# Patient Record
Sex: Female | Born: 2000 | Race: White | Hispanic: No | Smoking: Former smoker
Health system: Southern US, Community
[De-identification: ages and names within clinical notes are randomized; demographics above are authoritative.]

## PROBLEM LIST (undated history)

## (undated) DIAGNOSIS — F329 Major depressive disorder, single episode, unspecified: Secondary | ICD-10-CM

## (undated) DIAGNOSIS — N189 Chronic kidney disease, unspecified: Secondary | ICD-10-CM

## (undated) DIAGNOSIS — N920 Excessive and frequent menstruation with regular cycle: Secondary | ICD-10-CM

## (undated) DIAGNOSIS — M419 Scoliosis, unspecified: Secondary | ICD-10-CM

## (undated) DIAGNOSIS — F32A Depression, unspecified: Secondary | ICD-10-CM

## (undated) DIAGNOSIS — F419 Anxiety disorder, unspecified: Secondary | ICD-10-CM

## (undated) DIAGNOSIS — G43909 Migraine, unspecified, not intractable, without status migrainosus: Secondary | ICD-10-CM

## (undated) DIAGNOSIS — N390 Urinary tract infection, site not specified: Secondary | ICD-10-CM

## (undated) HISTORY — DX: Major depressive disorder, single episode, unspecified: F32.9

## (undated) HISTORY — DX: Urinary tract infection, site not specified: N39.0

## (undated) HISTORY — DX: Excessive and frequent menstruation with regular cycle: N92.0

## (undated) HISTORY — DX: Anxiety disorder, unspecified: F41.9

## (undated) HISTORY — PX: NO PAST SURGERIES: SHX2092

## (undated) HISTORY — DX: Migraine, unspecified, not intractable, without status migrainosus: G43.909

## (undated) HISTORY — PX: OTHER SURGICAL HISTORY: SHX169

## (undated) HISTORY — DX: Scoliosis, unspecified: M41.9

---
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
## 2020-01-07 DIAGNOSIS — J029 Acute pharyngitis, unspecified: Secondary | ICD-10-CM | POA: Diagnosis not present

## 2020-01-07 DIAGNOSIS — J039 Acute tonsillitis, unspecified: Secondary | ICD-10-CM | POA: Diagnosis not present

## 2020-02-13 ENCOUNTER — Ambulatory Visit: Payer: Medicaid Other | Admitting: Nurse Practitioner

## 2020-02-15 ENCOUNTER — Encounter: Payer: Self-pay | Admitting: Physician Assistant

## 2020-03-30 DIAGNOSIS — R002 Palpitations: Secondary | ICD-10-CM | POA: Diagnosis not present

## 2020-03-30 DIAGNOSIS — R079 Chest pain, unspecified: Secondary | ICD-10-CM | POA: Diagnosis not present

## 2020-03-31 DIAGNOSIS — R000 Tachycardia, unspecified: Secondary | ICD-10-CM | POA: Diagnosis not present

## 2020-03-31 DIAGNOSIS — I517 Cardiomegaly: Secondary | ICD-10-CM | POA: Diagnosis not present

## 2020-04-22 ENCOUNTER — Ambulatory Visit: Payer: Medicaid Other | Admitting: Nurse Practitioner

## 2020-04-22 ENCOUNTER — Encounter: Payer: Self-pay | Admitting: General Practice

## 2020-05-17 DIAGNOSIS — R5383 Other fatigue: Secondary | ICD-10-CM | POA: Diagnosis not present

## 2020-05-17 DIAGNOSIS — R0602 Shortness of breath: Secondary | ICD-10-CM | POA: Diagnosis not present

## 2020-05-17 DIAGNOSIS — R1084 Generalized abdominal pain: Secondary | ICD-10-CM | POA: Diagnosis not present

## 2020-05-17 DIAGNOSIS — R110 Nausea: Secondary | ICD-10-CM | POA: Diagnosis not present

## 2020-05-17 DIAGNOSIS — L679 Hair color and hair shaft abnormality, unspecified: Secondary | ICD-10-CM | POA: Diagnosis not present

## 2020-05-17 DIAGNOSIS — R630 Anorexia: Secondary | ICD-10-CM | POA: Diagnosis not present

## 2020-05-21 DIAGNOSIS — F411 Generalized anxiety disorder: Secondary | ICD-10-CM | POA: Diagnosis not present

## 2020-05-21 DIAGNOSIS — F32A Depression, unspecified: Secondary | ICD-10-CM | POA: Diagnosis not present

## 2020-05-28 ENCOUNTER — Ambulatory Visit: Payer: Self-pay | Admitting: Obstetrics and Gynecology

## 2020-05-28 DIAGNOSIS — L049 Acute lymphadenitis, unspecified: Secondary | ICD-10-CM | POA: Diagnosis not present

## 2020-05-28 DIAGNOSIS — Z91040 Latex allergy status: Secondary | ICD-10-CM | POA: Diagnosis not present

## 2020-05-28 DIAGNOSIS — R1909 Other intra-abdominal and pelvic swelling, mass and lump: Secondary | ICD-10-CM | POA: Diagnosis not present

## 2020-05-28 DIAGNOSIS — M7989 Other specified soft tissue disorders: Secondary | ICD-10-CM | POA: Diagnosis not present

## 2020-05-28 DIAGNOSIS — Z91041 Radiographic dye allergy status: Secondary | ICD-10-CM | POA: Diagnosis not present

## 2020-05-28 DIAGNOSIS — Z882 Allergy status to sulfonamides status: Secondary | ICD-10-CM | POA: Diagnosis not present

## 2020-05-28 DIAGNOSIS — M79605 Pain in left leg: Secondary | ICD-10-CM | POA: Diagnosis not present

## 2020-05-29 DIAGNOSIS — R0789 Other chest pain: Secondary | ICD-10-CM | POA: Diagnosis not present

## 2020-05-29 DIAGNOSIS — R590 Localized enlarged lymph nodes: Secondary | ICD-10-CM | POA: Diagnosis not present

## 2020-05-29 DIAGNOSIS — F419 Anxiety disorder, unspecified: Secondary | ICD-10-CM | POA: Diagnosis not present

## 2020-05-29 DIAGNOSIS — R0602 Shortness of breath: Secondary | ICD-10-CM | POA: Diagnosis not present

## 2020-05-29 DIAGNOSIS — R457 State of emotional shock and stress, unspecified: Secondary | ICD-10-CM | POA: Diagnosis not present

## 2020-05-29 DIAGNOSIS — R079 Chest pain, unspecified: Secondary | ICD-10-CM | POA: Diagnosis not present

## 2020-05-30 ENCOUNTER — Ambulatory Visit: Payer: Self-pay | Admitting: Advanced Practice Midwife

## 2020-05-30 DIAGNOSIS — F32A Depression, unspecified: Secondary | ICD-10-CM | POA: Diagnosis not present

## 2020-05-30 DIAGNOSIS — F4521 Hypochondriasis: Secondary | ICD-10-CM | POA: Diagnosis not present

## 2020-05-30 DIAGNOSIS — F411 Generalized anxiety disorder: Secondary | ICD-10-CM | POA: Diagnosis not present

## 2020-05-31 DIAGNOSIS — R002 Palpitations: Secondary | ICD-10-CM | POA: Diagnosis not present

## 2020-05-31 DIAGNOSIS — R7989 Other specified abnormal findings of blood chemistry: Secondary | ICD-10-CM | POA: Diagnosis not present

## 2020-05-31 DIAGNOSIS — G4700 Insomnia, unspecified: Secondary | ICD-10-CM | POA: Diagnosis not present

## 2020-05-31 DIAGNOSIS — R634 Abnormal weight loss: Secondary | ICD-10-CM | POA: Diagnosis not present

## 2020-05-31 DIAGNOSIS — F419 Anxiety disorder, unspecified: Secondary | ICD-10-CM | POA: Diagnosis not present

## 2020-05-31 DIAGNOSIS — R200 Anesthesia of skin: Secondary | ICD-10-CM | POA: Diagnosis not present

## 2020-06-04 ENCOUNTER — Ambulatory Visit: Payer: Self-pay | Admitting: Adult Health

## 2020-06-04 DIAGNOSIS — F411 Generalized anxiety disorder: Secondary | ICD-10-CM | POA: Diagnosis not present

## 2020-06-04 DIAGNOSIS — F4521 Hypochondriasis: Secondary | ICD-10-CM | POA: Diagnosis not present

## 2020-06-04 DIAGNOSIS — F32A Depression, unspecified: Secondary | ICD-10-CM | POA: Diagnosis not present

## 2020-06-05 ENCOUNTER — Ambulatory Visit (INDEPENDENT_AMBULATORY_CARE_PROVIDER_SITE_OTHER): Payer: Medicaid Other | Admitting: Adult Health

## 2020-06-05 ENCOUNTER — Encounter: Payer: Self-pay | Admitting: Adult Health

## 2020-06-05 ENCOUNTER — Other Ambulatory Visit: Payer: Self-pay

## 2020-06-05 VITALS — BP 120/80 | HR 55 | Ht 66.0 in | Wt 108.5 lb

## 2020-06-05 DIAGNOSIS — R599 Enlarged lymph nodes, unspecified: Secondary | ICD-10-CM | POA: Diagnosis not present

## 2020-06-05 NOTE — Progress Notes (Addendum)
  Subjective:     Patient ID: Yolanda Myers, female   DOB: 2001-04-10, 19 y.o.   MRN: [id number]  HPI Yolanda Myers is a 19 year old white female,single G0P0, in complaining of lump in left groin area, noticed 05/27/20 and it is tender and seems to have moved. First noticed when cat stepped on it.   Review of Systems Lump in left groin area noted 10.18/21 it is tender +hair loss,she had TSH 1.130 and Free T4 1.52 at [facility] 05/29/20. Reviewed past medical,surgical, social and family history. Reviewed medications and allergies.     Objective:   Physical Exam BP 120/80 (BP Location: Left Arm, Patient Position: Sitting, Cuff Size: Normal)   Pulse (!) 55   Ht 5\' 6"  (1.676 m)   Wt 108 lb 8 oz (49.2 kg)   LMP 05/29/2020 (Approximate)   BMI 17.51 kg/m   Skin warm and dry. Neck: mid line trachea, normal thyroid, good ROM, no lymphadenopathy noted. Hair is dyed blue on ends Has firm tender 1 cm lymph node in left groin, none on the right Examination chaperoned by 05/31/2020 LPN   Fall risk is low.   Upstream - 06/05/20 1023      Pregnancy Intention Screening   Does the patient want to become pregnant in the next year? No    Does the patient's partner want to become pregnant in the next year? No    Would the patient like to discuss contraceptive options today? No      Contraception Wrap Up   Current Method Abstinence    End Method Abstinence    Contraception Counseling Provided No          Assessment:     Lymph node enlargement - Plan: CBC w/Diff      Plan:     Return in 4 weeks for recheck, if has not resolved, will get 06/07/20  Get PCP

## 2020-06-06 LAB — CBC WITH DIFFERENTIAL/PLATELET
Basophils Absolute: 0.1 10*3/uL (ref 0.0–0.2)
Basos: 1 %
EOS (ABSOLUTE): 0.2 10*3/uL (ref 0.0–0.4)
Eos: 3 %
Hematocrit: 40.8 % (ref 34.0–46.6)
Hemoglobin: 14 g/dL (ref 11.1–15.9)
Immature Grans (Abs): 0 10*3/uL (ref 0.0–0.1)
Immature Granulocytes: 0 %
Lymphocytes Absolute: 2.9 10*3/uL (ref 0.7–3.1)
Lymphs: 45 %
MCH: 30.9 pg (ref 26.6–33.0)
MCHC: 34.3 g/dL (ref 31.5–35.7)
MCV: 90 fL (ref 79–97)
Monocytes Absolute: 0.7 10*3/uL (ref 0.1–0.9)
Monocytes: 10 %
Neutrophils Absolute: 2.6 10*3/uL (ref 1.4–7.0)
Neutrophils: 41 %
Platelets: 283 10*3/uL (ref 150–450)
RBC: 4.53 x10E6/uL (ref 3.77–5.28)
RDW: 12.7 % (ref 11.7–15.4)
WBC: 6.3 10*3/uL (ref 3.4–10.8)

## 2020-06-10 ENCOUNTER — Telehealth: Payer: Self-pay | Admitting: *Deleted

## 2020-06-10 NOTE — Telephone Encounter (Signed)
Pt aware CBC was normal. Pt voiced understanding. JSY

## 2020-06-10 NOTE — Telephone Encounter (Signed)
-----   Message from Adline Potter, NP sent at 06/07/2020  4:34 PM EDT ----- Let pt know CBC was normal

## 2020-06-18 DIAGNOSIS — E0590 Thyrotoxicosis, unspecified without thyrotoxic crisis or storm: Secondary | ICD-10-CM | POA: Diagnosis not present

## 2020-07-03 ENCOUNTER — Ambulatory Visit: Payer: Medicaid Other | Admitting: Adult Health

## 2020-07-08 DIAGNOSIS — R0789 Other chest pain: Secondary | ICD-10-CM | POA: Diagnosis not present

## 2020-07-08 DIAGNOSIS — R079 Chest pain, unspecified: Secondary | ICD-10-CM | POA: Diagnosis not present

## 2020-07-09 ENCOUNTER — Telehealth: Payer: Self-pay

## 2020-07-09 NOTE — Telephone Encounter (Signed)
Transition Care Management Unsuccessful Follow-up Telephone Call  Date of discharge and from where: 07/08/2020 [facility] ED  Attempts:  1st Attempt  Reason for unsuccessful TCM follow-up call:  Left voice message

## 2020-07-10 NOTE — Telephone Encounter (Signed)
Transition Care Management Unsuccessful Follow-up Telephone Call  Date of discharge and from where:  07/08/2020 [facility] ED  Attempts:  2nd Attempt  Reason for unsuccessful TCM follow-up call:  Left voice message

## 2020-07-11 NOTE — Telephone Encounter (Signed)
Transition Care Management Unsuccessful Follow-up Telephone Call  Date of discharge and from where:   07/08/2020 [facility] ED  Attempts:  3rd Attempt  Reason for unsuccessful TCM follow-up call:  Left voice message

## 2020-07-16 ENCOUNTER — Other Ambulatory Visit: Payer: Self-pay

## 2020-07-16 ENCOUNTER — Other Ambulatory Visit (HOSPITAL_COMMUNITY)
Admission: RE | Admit: 2020-07-16 | Discharge: 2020-07-16 | Disposition: A | Discharge status: Home / Self Care | Payer: Medicaid Other | Source: Ambulatory Visit | Attending: Adult Health | Admitting: Adult Health

## 2020-07-16 ENCOUNTER — Ambulatory Visit (INDEPENDENT_AMBULATORY_CARE_PROVIDER_SITE_OTHER): Payer: Medicaid Other | Admitting: Adult Health

## 2020-07-16 ENCOUNTER — Encounter: Payer: Self-pay | Admitting: Adult Health

## 2020-07-16 VITALS — BP 86/54 | HR 86 | Ht 65.0 in | Wt 112.0 lb

## 2020-07-16 DIAGNOSIS — R599 Enlarged lymph nodes, unspecified: Secondary | ICD-10-CM

## 2020-07-16 DIAGNOSIS — N898 Other specified noninflammatory disorders of vagina: Secondary | ICD-10-CM | POA: Diagnosis not present

## 2020-07-16 NOTE — Progress Notes (Signed)
  Subjective:     Patient ID: Yolanda Myers, female   DOB: [birth year], 19 y.o.   MRN: [id number]  HPI Yolanda Myers is a 19 year old white female,single, G0P0 back in follow up on swollen lymph node in groin, it has resolved, had discharge about 2 weeks, wondered if BV. WBC was 6.3 on 06/05/20.    Review of Systems Had vaginal discharge 2 weeks ago No lymph node now  Reviewed past medical,surgical, social and family history. Reviewed medications and allergies.     Objective:   Physical Exam BP (!) 86/54 (BP Location: Left Arm, Patient Position: Sitting, Cuff Size: Normal)   Pulse 86   Ht 5\' 5"  (1.651 m)   Wt 112 lb (50.8 kg)   LMP 06/24/2020 (Approximate)   BMI 18.64 kg/m   Skin warm and dry and lymph node in left groin has resolved. She did self vaginal swab.   Upstream - 07/16/20 1359      Pregnancy Intention Screening   Does the patient want to become pregnant in the next year? No    Does the patient's partner want to become pregnant in the next year? No    Would the patient like to discuss contraceptive options today? No      Contraception Wrap Up   Current Method No Contraceptive Precautions    End Method No Contraception Precautions    Contraception Counseling Provided No             Assessment:     1. Lymph node enlargement,resolved   2. Vaginal discharge CV swab sent for GC/CHL,trich and BV,yeast     Plan:     Follow up prn

## 2020-07-18 LAB — CERVICOVAGINAL ANCILLARY ONLY
Bacterial Vaginitis (gardnerella): NEGATIVE
Candida Glabrata: NEGATIVE
Candida Vaginitis: NEGATIVE
Chlamydia: NEGATIVE
Comment: NEGATIVE
Comment: NEGATIVE
Comment: NEGATIVE
Comment: NEGATIVE
Comment: NEGATIVE
Comment: NORMAL
Neisseria Gonorrhea: NEGATIVE
Trichomonas: NEGATIVE

## 2020-09-04 DIAGNOSIS — Z1159 Encounter for screening for other viral diseases: Secondary | ICD-10-CM | POA: Diagnosis not present

## 2020-11-13 ENCOUNTER — Encounter: Payer: Self-pay | Admitting: *Deleted

## 2020-11-14 ENCOUNTER — Ambulatory Visit: Payer: Medicaid Other | Admitting: Cardiology

## 2020-11-14 ENCOUNTER — Encounter: Payer: Self-pay | Admitting: Cardiology

## 2020-11-14 NOTE — Progress Notes (Deleted)
Cardiology Office Note  Date: 11/14/2020   ID: Nehal, Witting 04-06-2001, MRN [id number]  PCP:  Jason Coop, FNP  Cardiologist:  Nona Dell, MD Electrophysiologist:  None   No chief complaint on file.   History of Present Illness: Yolanda Myers is a 20 y.o. female referred for cardiology consultation by Mr. Harle Stanford NP for the evaluation of chest pain.  Past Medical History:  Diagnosis Date  . Anxiety   . Frequent UTI   . Heavy menses   . Migraine headache     Past Surgical History:  Procedure Laterality Date  . No prior surgeries      Current Outpatient Medications  Medication Sig Dispense Refill  . omeprazole (PRILOSEC) 20 MG capsule Take 1 capsule (20 mg total) by mouth daily. 30 capsule 11   No current facility-administered medications for this visit.   Allergies:  Imitrex [sumatriptan] and Sulfa antibiotics   Social History: The patient  reports that she has quit smoking. Her smoking use included cigarettes and e-cigarettes. She has never used smokeless tobacco. She reports current alcohol use. She reports that she does not use drugs.   Family History: The patient's family history includes Asthma in her maternal grandfather, maternal grandmother, and mother; COPD in her maternal grandmother; Depression in her maternal grandmother; Hearing loss in her maternal grandmother; Heart disease in her maternal grandmother; Hypertension in her maternal grandfather and maternal grandmother; Multiple sclerosis in her maternal grandmother; Seizures in her maternal grandmother.   ROS:  Please see the history of present illness. Otherwise, complete review of systems is positive for {NONE DEFAULTED:18576::"none"}.  All other systems are reviewed and negative.   Physical Exam: VS:  There were no vitals taken for this visit., BMI There is no height or weight on file to calculate BMI.  Wt Readings from Last 3 Encounters:  07/16/20 112 lb (50.8 kg) (19  %, Z= -0.88)*  06/05/20 108 lb 8 oz (49.2 kg) (13 %, Z= -1.11)*  08/15/19 107 lb 6.4 oz (48.7 kg) (14 %, Z= -1.10)*   * Growth percentiles are based on CDC (Girls, 2-20 Years) data.    General: Patient appears comfortable at rest. HEENT: Conjunctiva and lids normal, oropharynx clear with moist mucosa. Neck: Supple, no elevated JVP or carotid bruits, no thyromegaly. Lungs: Clear to auscultation, nonlabored breathing at rest. Cardiac: Regular rate and rhythm, no S3 or significant systolic murmur, no pericardial rub. Abdomen: Soft, nontender, no hepatomegaly, bowel sounds present, no guarding or rebound. Extremities: No pitting edema, distal pulses 2+. Skin: Warm and dry. Musculoskeletal: No kyphosis. Neuropsychiatric: Alert and oriented x3, affect grossly appropriate.  ECG:  An ECG dated 02/09/2017 was personally reviewed today and demonstrated:  Sinus rhythm, possible left atrial enlargement  Recent Labwork: 06/05/2020: Hemoglobin 14.0; Platelets 283  February 2022: Hemoglobin 11.5, potassium 3.9, BUN 5, creatinine 0.57, troponin T 106 and 109  Other Studies Reviewed Today:  Chest CTA 10/05/2020 ([facility]): PULMONARY ARTERIES:  No pulmonary emboli are identified.  RV/LV ratio: <0.9  Interventricular septum: No bowing  IVC/hepatic vein reflux: None   LUNGS/PLEURA:  No focal airspace opacities/consolidations.  No pneumothorax.  There is a small left upper lobe pulmonary nodule image 32. There are 3 small pulmonary nodules in the right middle lobe and right lower lobe on images 63, 64 and 69. The nodules are in the 3 to 4 mm range.  No pleural effusions.   HEART/MEDIASTINUM:  Cardiac size is normal.  No acute thoracic  aortic abnormalities.  No hilar, mediastinal, or axillary lymphadenopathy.   MUSCULOSKELETAL:  No acute or destructive osseous processes.   MISC:  N.A.   IMPRESSION:  1.No pulmonary embolism.  2. No acute infiltrate.  3. Small bilateral pulmonary nodules.  See below   Excerpt from "Guidelines for Management of Small Pulmonary Nodules Detected on CT Scans: A Statement from the Fleischner Society" (Radiology 2005;237:395-400).   1. Nodules less than or equal to 75mm: No follow up needed if the patient is at low risk for malignancy (risk of malignancy in this category is less than 1%). If patient is at increased risk, 12 month follow up is recommended. If unchanged after 12 months, no further work-up is needed.   2. Nodules greater than 4 and up to 76mm: If low risk, initial follow up at 12 months. If unchanged after 12 months, no further follow-up. For patients at increased risk of malignancy, initial follow up CT at 6-12 months, then at 18-24 months if unchanged.   3. Nodules greater than 6 and up to 39mm: If low risk, initial follow up CT at 6-12 months, then at 18-24 months if unchanged. For patients at increased risk of malignancy, initial follow up CT at 3-6 months, then 9-12, and at 24 months if no change.   4. Nodules greater than 31mm: For either low or high risk patients, initial follow up at 3 months. Continued follow up at 9 and 24 months (from baseline) if stable at each interval. Alternatively consider dynamic contrast enhanced CT, PET, or biopsy.   (Note: Low risk is defined as minimal or no history of smoking and of other known risk factors.)    Assessment and Plan:    Medication Adjustments/Labs and Tests Ordered: Current medicines are reviewed at length with the patient today.  Concerns regarding medicines are outlined above.   Tests Ordered: No orders of the defined types were placed in this encounter.   Medication Changes: No orders of the defined types were placed in this encounter.   Disposition:  Follow up {follow up:15908}  Signed, Jonelle Sidle, MD, [facility] 11/14/2020 2:17 PM    [facility] at [facility] [address] [place], [place], [place] [address] Phone: [phone]; Fax: [phone]

## 2020-11-22 ENCOUNTER — Other Ambulatory Visit: Payer: Self-pay

## 2020-11-22 ENCOUNTER — Ambulatory Visit (INDEPENDENT_AMBULATORY_CARE_PROVIDER_SITE_OTHER): Payer: Medicaid Other | Admitting: Cardiology

## 2020-11-22 ENCOUNTER — Other Ambulatory Visit
Admission: RE | Admit: 2020-11-22 | Discharge: 2020-11-22 | Disposition: A | Discharge status: Home / Self Care | Payer: Medicaid Other | Source: Ambulatory Visit | Attending: Cardiology | Admitting: Cardiology

## 2020-11-22 ENCOUNTER — Encounter: Payer: Self-pay | Admitting: Cardiology

## 2020-11-22 VITALS — BP 134/70 | HR 114 | Ht 65.0 in | Wt 111.0 lb

## 2020-11-22 DIAGNOSIS — R079 Chest pain, unspecified: Secondary | ICD-10-CM | POA: Insufficient documentation

## 2020-11-22 LAB — PREGNANCY, URINE: Preg Test, Ur: NEGATIVE

## 2020-11-22 LAB — HCG, QUANTITATIVE, PREGNANCY: hCG, Beta Chain, Quant, S: <1 m[IU]/mL (ref <5)

## 2020-11-22 NOTE — Progress Notes (Signed)
Cardiology Office Note:    Date:  11/22/2020   ID:  Yolanda, Myers 13-Apr-2001, MRN [id number]  PCP:  Jason Coop, FNP   [facility]  Cardiologist:  No primary care provider on file.  Advanced Practice Provider:  No care team member to display Electrophysiologist:  None       Referring MD: Bennie Hind I*   Chief Complaint  Patient presents with  . New Patient (Initial Visit)    Referred by PCP for chest pain. Meds reviewed verbally with patient.     History of Present Illness:    Yolanda Myers is a 20 y.o. female with a hx of anxiety, smoking who presents due to chest pain.  Patient states having symptoms of chest pain beginning September 2021 after presenting to the hospital to obtain an ultrasound.  Describes symptoms as pressure, occasionally sharp.  Symptoms are usually on the left side, sometimes central.  She has presented to the emergency room/urgent care multiple times, symptoms have been related to anxiety.  Currently seen in [place], no appointment was available at the [facility] office.  States having abnormal troponin levels.  Past Medical History:  Diagnosis Date  . Anxiety   . Frequent UTI   . Heavy menses   . Migraine headache     Past Surgical History:  Procedure Laterality Date  . No prior surgeries      Current Medications: Current Meds  Medication Sig  . nitrofurantoin, macrocrystal-monohydrate, (MACROBID) 100 MG capsule Take 100 mg by mouth 2 (two) times daily.  Marland Kitchen omeprazole (PRILOSEC) 20 MG capsule Take 1 capsule (20 mg total) by mouth daily.     Allergies:   Imitrex [sumatriptan] and Sulfa antibiotics   Social History   Socioeconomic History  . Marital status: Single    Spouse name: Not on file  . Number of children: Not on file  . Years of education: Not on file  . Highest education level: Not on file  Occupational History  . Not on file  Tobacco Use  . Smoking status: Former Smoker     Types: Cigarettes, E-cigarettes  . Smokeless tobacco: Never Used  Vaping Use  . Vaping Use: Some days  Substance and Sexual Activity  . Alcohol use: Yes    Comment: Occasional  . Drug use: No  . Sexual activity: Not on file  Other Topics Concern  . Not on file  Social History Narrative   Yolanda Myers is a 20 yo girl.   She is in the process of getting her GED.   She lives with her mom only.   She has no siblings.   She enjoys painting, drawing, and being outside.   Social Determinants of Corporate investment banker Strain: Not on file  Food Insecurity: Not on file  Transportation Needs: Not on file  Physical Activity: Not on file  Stress: Not on file  Social Connections: Not on file     Family History: The patient's family history includes Asthma in her maternal grandfather, maternal grandmother, and mother; COPD in her maternal grandmother; Depression in her maternal grandmother; Hearing loss in her maternal grandmother; Heart disease in her maternal grandmother; Hypertension in her maternal grandfather and maternal grandmother; Multiple sclerosis in her maternal grandmother; Seizures in her maternal grandmother.  ROS:   Please see the history of present illness.     All other systems reviewed and are negative.  EKGs/Labs/Other Studies Reviewed:    The  following studies were reviewed today:   EKG:  EKG is  ordered today.  The ekg ordered today demonstrates sinus tachycardia  Recent Labs: 06/05/2020: Hemoglobin 14.0; Platelets 283  Recent Lipid Panel No results found for: CHOL, TRIG, HDL, CHOLHDL, VLDL, LDLCALC, LDLDIRECT   Risk Assessment/Calculations:      Physical Exam:    VS:  BP 134/70 (BP Location: Left Arm, Patient Position: Sitting, Cuff Size: Normal)   Pulse (!) 114   Ht 5\' 5"  (1.651 m)   Wt 111 lb (50.3 kg)   SpO2 99%   BMI 18.47 kg/m     Wt Readings from Last 3 Encounters:  11/22/20 111 lb (50.3 kg) (17 %, Z= -0.97)*  07/16/20 112 lb (50.8 kg)  (19 %, Z= -0.88)*  06/05/20 108 lb 8 oz (49.2 kg) (13 %, Z= -1.11)*   * Growth percentiles are based on CDC (Girls, 2-20 Years) data.     GEN:  Well nourished, well developed in no acute distress HEENT: Normal NECK: No JVD; No carotid bruits LYMPHATICS: No lymphadenopathy CARDIAC: RRR, no murmurs, rubs, gallops RESPIRATORY:  Clear to auscultation without rales, wheezing or rhonchi  ABDOMEN: Soft, non-tender, non-distended MUSCULOSKELETAL:  No edema; No deformity  SKIN: Warm and dry NEUROLOGIC:  Alert and oriented x 3 PSYCHIATRIC:  Normal affect   ASSESSMENT:    1. Chest pain, unspecified type    PLAN:    In order of problems listed above:  1. Patient with chest pain, history of smoking.  Multiple visits to urgent care due to chest pain.  Symptoms could be attributed to anxiety due to low cardiac risk, but cardiac evaluation will go along with to reassure patient if normal.  Obtain echocardiogram, Lexiscan Myoview to evaluate presence of ischemia and heart function.  We will call patient on test results.  Plan follow-up if abnormal findings are noted.  She lives in the [place], plans to follow-up with our clinic there for cardiac concerns due to easier commute.   Shared Decision Making/Informed Consent The risks [chest pain, shortness of breath, cardiac arrhythmias, dizziness, blood pressure fluctuations, myocardial infarction, stroke/transient ischemic attack, nausea, vomiting, allergic reaction, radiation exposure, metallic taste sensation and life-threatening complications (estimated to be 1 in 10,000)], benefits (risk stratification, diagnosing coronary artery disease, treatment guidance) and alternatives of a nuclear stress test were discussed in detail with Yolanda Myers and she agrees to proceed.       Medication Adjustments/Labs and Tests Ordered: Current medicines are reviewed at length with the patient today.  Concerns regarding medicines are outlined above.  Orders Placed  This Encounter  Procedures  . NM Myocar Multi W/Spect W/Wall Motion / EF  . Pregnancy, urine  . EKG 12-Lead  . ECHOCARDIOGRAM COMPLETE   No orders of the defined types were placed in this encounter.   Patient Instructions  Medication Instructions:   *If you need a refill on your cardiac medications before your next appointment, please call your pharmacy*   Lab Work: None ordered If you have labs (blood work) drawn today and your tests are completely normal, you will receive your results only by: Gwenevere Abbot MyChart Message (if you have MyChart) OR . A paper copy in the mail If you have any lab test that is abnormal or we need to change your treatment, we will call you to review the results.   Testing/Procedures:  1.  Your physician has requested that you have an echocardiogram. Echocardiography is a painless test that uses sound waves to create  images of your heart. It provides your doctor with information about the size and shape of your heart and how well your heart's chambers and valves are working. This procedure takes approximately one hour. There are no restrictions for this procedure.   2.  [facility] MYOVIEW       Your caregiver has ordered a Stress Test with nuclear imaging. The purpose of this test is to evaluate the blood supply to your heart muscle. This procedure is referred to as a "Non-Invasive Stress Test." This is because other than having an IV started in your vein, nothing is inserted or "invades" your body. Cardiac stress tests are done to find areas of poor blood flow to the heart by determining the extent of coronary artery disease (CAD). Some patients exercise on a treadmill, which naturally increases the blood flow to your heart, while others who are  unable to walk on a treadmill due to physical limitations have a pharmacologic/chemical stress agent called Lexiscan . This medicine will mimic walking on a treadmill by temporarily increasing your coronary blood flow.       PLEASE REPORT TO [facility] MEDICAL MALL ENTRANCE   THE VOLUNTEERS AT THE FIRST DESK WILL DIRECT YOU WHERE TO GO     *Please note: these test may take anywhere between 2-4 hours to complete       Date of Procedure:_____________________________________   Arrival Time for Procedure:______________________________    PLEASE NOTIFY THE OFFICE AT LEAST 24 HOURS IN ADVANCE IF YOU ARE UNABLE TO KEEP YOUR APPOINTMENT.  [phone]  PLEASE NOTIFY NUCLEAR MEDICINE AT [facility] AT LEAST 24 HOURS IN ADVANCE IF YOU ARE UNABLE TO KEEP YOUR APPOINTMENT. [phone]      How to prepare for your Myoview test:      1. Do not eat or drink after midnight  2. No caffeine for 24 hours prior to test  3. No smoking 24 hours prior to test.  4. Unless instructed otherwise, Take your medication with a small sips of water.    5.         Ladies, please do not wear dresses. Skirts or pants are appropriate. Please wear a short sleeve shirt.  6. No perfume, cologne or lotion.  7. Wear comfortable walking shoes. No heels!      Follow-Up: At [facility], you and your health needs are our priority.  As part of our continuing mission to provide you with exceptional heart care, we have created designated Provider Care Teams.  These Care Teams include your primary Cardiologist (physician) and Advanced Practice Providers (APPs -  Physician Assistants and Nurse Practitioners) who all work together to provide you with the care you need, when you need it.  We recommend signing up for the patient portal called "MyChart".  Sign up information is provided on this After Visit Summary.  MyChart is used to connect with patients for Virtual Visits (Telemedicine).  Patients are able to view lab/test results, encounter notes, upcoming appointments, etc.  Non-urgent messages can be sent to your provider as well.   To learn more about what you can do with MyChart, go to [email or web address].    Your next appointment:    Patient to  follow up at [facility] office [phone]  The format for your next appointment:   In Person  Provider:   Debbe Odea, MD   Other Instructions      Signed, Debbe Odea, MD  11/22/2020 4:57 PM    [facility]

## 2020-11-22 NOTE — Patient Instructions (Signed)
Medication Instructions:   *If you need a refill on your cardiac medications before your next appointment, please call your pharmacy*   Lab Work: None ordered If you have labs (blood work) drawn today and your tests are completely normal, you will receive your results only by: Marland Kitchen MyChart Message (if you have MyChart) OR . A paper copy in the mail If you have any lab test that is abnormal or we need to change your treatment, we will call you to review the results.   Testing/Procedures:  1.  Your physician has requested that you have an echocardiogram. Echocardiography is a painless test that uses sound waves to create images of your heart. It provides your doctor with information about the size and shape of your heart and how well your heart's chambers and valves are working. This procedure takes approximately one hour. There are no restrictions for this procedure.   2.  [facility] MYOVIEW       Your caregiver has ordered a Stress Test with nuclear imaging. The purpose of this test is to evaluate the blood supply to your heart muscle. This procedure is referred to as a "Non-Invasive Stress Test." This is because other than having an IV started in your vein, nothing is inserted or "invades" your body. Cardiac stress tests are done to find areas of poor blood flow to the heart by determining the extent of coronary artery disease (CAD). Some patients exercise on a treadmill, which naturally increases the blood flow to your heart, while others who are  unable to walk on a treadmill due to physical limitations have a pharmacologic/chemical stress agent called Lexiscan . This medicine will mimic walking on a treadmill by temporarily increasing your coronary blood flow.      PLEASE REPORT TO [facility] MEDICAL MALL ENTRANCE   THE VOLUNTEERS AT THE FIRST DESK WILL DIRECT YOU WHERE TO GO     *Please note: these test may take anywhere between 2-4 hours to complete       Date of  Procedure:_____________________________________   Arrival Time for Procedure:______________________________    PLEASE NOTIFY THE OFFICE AT LEAST 24 HOURS IN ADVANCE IF YOU ARE UNABLE TO KEEP YOUR APPOINTMENT.  [phone]  PLEASE NOTIFY NUCLEAR MEDICINE AT [facility] AT LEAST 24 HOURS IN ADVANCE IF YOU ARE UNABLE TO KEEP YOUR APPOINTMENT. [phone]      How to prepare for your Myoview test:      1. Do not eat or drink after midnight  2. No caffeine for 24 hours prior to test  3. No smoking 24 hours prior to test.  4. Unless instructed otherwise, Take your medication with a small sips of water.    5.         Ladies, please do not wear dresses. Skirts or pants are appropriate. Please wear a short sleeve shirt.  6. No perfume, cologne or lotion.  7. Wear comfortable walking shoes. No heels!      Follow-Up: At [facility], you and your health needs are our priority.  As part of our continuing mission to provide you with exceptional heart care, we have created designated Provider Care Teams.  These Care Teams include your primary Cardiologist (physician) and Advanced Practice Providers (APPs -  Physician Assistants and Nurse Practitioners) who all work together to provide you with the care you need, when you need it.  We recommend signing up for the patient portal called "MyChart".  Sign up information is provided on this After Visit Summary.  MyChart is used to connect with patients for Virtual Visits (Telemedicine).  Patients are able to view lab/test results, encounter notes, upcoming appointments, etc.  Non-urgent messages can be sent to your provider as well.   To learn more about what you can do with MyChart, go to [email or web address].    Your next appointment:    Patient to follow up at [facility] office [phone]  The format for your next appointment:   In Person  Provider:   Debbe Odea, MD   Other Instructions

## 2020-11-25 ENCOUNTER — Telehealth: Payer: Self-pay

## 2020-11-25 NOTE — Telephone Encounter (Signed)
Margrett Rud, [place]  11/25/2020 1:37 PM EDT Back to Top     Attempted to call patient. No answer. LMOV.    Debbe Odea, MD  11/25/2020 1:04 PM EDT      Pregnancy test negative. Okay for [employer].   Gibson Ramp, RN  11/25/2020 9:34 AM EDT      Preliminary results reviewed. Forwarded to MD desktop for review and signature.

## 2020-11-28 ENCOUNTER — Telehealth: Payer: Self-pay

## 2020-11-28 DIAGNOSIS — R079 Chest pain, unspecified: Secondary | ICD-10-CM

## 2020-11-28 NOTE — Telephone Encounter (Signed)
-----   Message from Debbe Odea, MD sent at 11/27/2020  4:16 PM EDT ----- Regarding: RE: AUTH DENIAL Please tell patient stress test/Lexiscan Myoview was declined by her insurance company.  Jaimarie Rapozo please order an ETT instead.    Thank you. ----- Message ----- From: Francine Graven Sent: 11/27/2020  11:54 AM EDT To: Debbe Odea, MD, # Subject: Ollen Bowl                                    Good Morning,  Pt's nuc Berkley Harvey has been denied with [facility]. A peer to peer may be requested within 2 days, but I am faxing an appeal today. Following are the denial reasons and peer to peer information.  The [facility] Utilization Management Department received your request for: Member Name: Yolanda Myers Member #: [id number] CPT/HCPCS code(s): 46962  The clinical information you submitted was reviewed by the medical review team. At this time, the above request has been disallowed due to: Your doctor is checking you for a condition causing decreased blood, oxygen and nutrients to flow to the heart. This happens when blood vessels in the heart become narrowed or blocked. Your doctor ordered a special heart test. This test measures blood flow to the heart. This test should be used when you cannot walk on a treadmill for reasons other than obesity. Your doctor did not tell us that you cannot walk on a treadmill. For this reason, this test is not medically necessary for you. We used AIM Specialty Health Guideline titled Imaging of the Heart, Myocardial Perfusion Imaging to make this decision. You may view this guideline at [email or web address]. A second review, called EPSDT (early periodic screening, diagnosis, and treatment) was attempted. We do not have labs to review. If your doctor can tell us why you need this care, we may be able to approve your request. We recommend that your doctors send records to show why you need this treatment.  Please see your Member Handbook for more detail on EPSDT.Marland Kitchen  Reference number: [id number]. Please note that this is only a reference number; it is not an authorization. Your formal written Notice of Adverse Benefit Determination will be mailed following this communication. Benefits are subject to eligibility and plan provisions.  A peer-to-peer reconsideration may be requested if appropriate clinical documentation was submitted for review. Please note, the peer-to-peer must be requested within two business days of the adverse determination. A formal appeal must be filed after this time frame. To request a peer-to-peer reconsideration, please call [phone] ext. [phone]. Please do not send any protected health information or requests via email due to HIP

## 2020-11-28 NOTE — Telephone Encounter (Signed)
Left a VM at patients home and on her cell phone to call me back so that I can inform her that we need to do and ETT rather than the Lexiscan as her insurance will not approve that.  Will give the patient the following instructions when she calls back and sign the order at that time.  - you may eat a light breakfast/ lunch prior to your procedure - no caffeine for 24 hours prior to your test (coffee, tea, soft drinks, or chocolate)  - no smoking/ vaping for 4 hours prior to your test - you may take your regular medications the day of your test - wear comfortable clothing & tennis/ non-skid shoes to walk on the treadmill

## 2020-11-29 ENCOUNTER — Other Ambulatory Visit: Payer: Self-pay

## 2020-11-29 ENCOUNTER — Ambulatory Visit (INDEPENDENT_AMBULATORY_CARE_PROVIDER_SITE_OTHER): Payer: Medicaid Other

## 2020-11-29 DIAGNOSIS — R079 Chest pain, unspecified: Secondary | ICD-10-CM | POA: Diagnosis not present

## 2020-11-29 LAB — ECHOCARDIOGRAM COMPLETE
Area-P 1/2: 7.02 cm2
S' Lateral: 3 cm

## 2020-12-01 NOTE — Progress Notes (Deleted)
Cardiology Office Note  Date: 12/01/2020   ID: Canna, Nickelson [birth year], MRN [id number]  PCP:  Jason Coop, FNP  Cardiologist:  No primary care provider on file. Electrophysiologist:  None   Chief Complaint: Recurring CP / Elevated HR  History of Present Illness: Yolanda Myers is a 20 y.o. female with a history of Chest pain / racing heart.   Last seen by Dr Debbe Odea Cardiology. She was having symptoms of chest pain starting in September 2021. She described symptoms as pressure like and occasionally sharp. Located on left side, sometimes central. She had multiple ER / UC visits for CP. Symptoms related to anxiety. she reporte having abnormal troponin levels. Cardiologist ordered Echocardiogram and Lexiscan Myoview to evaulate presence of ischemia.      Does she need a monitor ? ETT is pending . Echo negative . Stop smoking   Past Medical History:  Diagnosis Date  . Anxiety   . Frequent UTI   . Heavy menses   . Migraine headache     Past Surgical History:  Procedure Laterality Date  . No prior surgeries      Current Outpatient Medications  Medication Sig Dispense Refill  . nitrofurantoin, macrocrystal-monohydrate, (MACROBID) 100 MG capsule Take 100 mg by mouth 2 (two) times daily.    Marland Kitchen omeprazole (PRILOSEC) 20 MG capsule Take 1 capsule (20 mg total) by mouth daily. 30 capsule 11   No current facility-administered medications for this visit.   Allergies:  Imitrex [sumatriptan] and Sulfa antibiotics   Social History: The patient  reports that she has quit smoking. Her smoking use included cigarettes and e-cigarettes. She has never used smokeless tobacco. She reports current alcohol use. She reports that she does not use drugs.   Family History: The patient's family history includes Asthma in her maternal grandfather, maternal grandmother, and mother; COPD in her maternal grandmother; Depression in her maternal grandmother; Hearing loss  in her maternal grandmother; Heart disease in her maternal grandmother; Hypertension in her maternal grandfather and maternal grandmother; Multiple sclerosis in her maternal grandmother; Seizures in her maternal grandmother.   ROS:  Please see the history of present illness. Otherwise, complete review of systems is positive for {NONE DEFAULTED:18576::"none"}.  All other systems are reviewed and negative.   Physical Exam: VS:  There were no vitals taken for this visit., BMI There is no height or weight on file to calculate BMI.  Wt Readings from Last 3 Encounters:  11/22/20 111 lb (50.3 kg) (17 %, Z= -0.97)*  07/16/20 112 lb (50.8 kg) (19 %, Z= -0.88)*  06/05/20 108 lb 8 oz (49.2 kg) (13 %, Z= -1.11)*   * Growth percentiles are based on CDC (Girls, 2-20 Years) data.    General: Patient appears comfortable at rest. HEENT: Conjunctiva and lids normal, oropharynx clear with moist mucosa. Neck: Supple, no elevated JVP or carotid bruits, no thyromegaly. Lungs: Clear to auscultation, nonlabored breathing at rest. Cardiac: Regular rate and rhythm, no S3 or significant systolic murmur, no pericardial rub. Abdomen: Soft, nontender, no hepatomegaly, bowel sounds present, no guarding or rebound. Extremities: No pitting edema, distal pulses 2+. Skin: Warm and dry. Musculoskeletal: No kyphosis. Neuropsychiatric: Alert and oriented x3, affect grossly appropriate.  ECG:  {EKG/Telemetry Strips [phone]}  Recent Labwork: 06/05/2020: Hemoglobin 14.0; Platelets 283  No results found for: CHOL, TRIG, HDL, CHOLHDL, VLDL, LDLCALC, LDLDIRECT  Other Studies Reviewed Today:   Echocardiogram 11/29/2020  1. Left ventricular ejection fraction, by estimation, is  55 to 60%. The left ventricle has normal function. The left ventricle has no regional wall motion abnormalities. Left ventricular diastolic parameters were normal. 2. Right ventricular systolic function is normal. The right ventricular  size is normal.   Left Ventricle: Left ventricular ejection fraction, by estimation, is 55 to 60%. The left ventricle has normal function. The left ventricle has no regional wall motion abnormalities. The left ventricular internal cavity size was normal in size. There is no left ventricular hypertrophy. Left ventricular diastolic parameters were normal. Right Ventricle: The right ventricular size is normal. No increase in right ventricular wall thickness. Right ventricular systolic function is normal. Left Atrium: Left atrial size was normal in size. Right Atrium: Right atrial size was normal in size. Pericardium: There is no evidence of pericardial effusion. Mitral Valve: The mitral valve is normal in structure. No evidence of mitral valve regurgitation. No evidence  of mitral valve stenosis. Tricuspid Valve: The tricuspid valve is normal in structure. Tricuspid valve regurgitation is not demonstrated. No evidence of tricuspid stenosis. Aortic Valve: The aortic valve is normal in structure. Aortic valve regurgitation is not visualized. No aortic stenosis is present. Pulmonic Valve: The pulmonic valve was normal in structure. Pulmonic valve regurgitation is not visualized. No evidence of pulmonic stenosis. Aorta: The aortic root is normal in size and structure. Venous: The inferior vena cava is normal in size with greater than 50% respiratory variability, suggesting right atrial pressure of 3 mmHg. IAS/Shunts: No atrial level shunt detected by color flow Doppler.  Assessment and Plan:  1. Chest pain, unspecified type   2. Tobacco abuse   3. Sinus tachycardia by electrocardiogram      Medication Adjustments/Labs and Tests Ordered: Current medicines are reviewed at length with the patient today.  Concerns regarding medicines are outlined above.   Disposition: Follow-up with ***  Signed, Rennis Harding, NP 12/01/2020 8:01 PM    [facility] [address] [place], [place], [place] [address] Phone: [phone]; Fax: [phone]

## 2020-12-02 ENCOUNTER — Ambulatory Visit: Payer: Medicaid Other | Admitting: Family Medicine

## 2020-12-02 DIAGNOSIS — R000 Tachycardia, unspecified: Secondary | ICD-10-CM

## 2020-12-02 DIAGNOSIS — R079 Chest pain, unspecified: Secondary | ICD-10-CM

## 2020-12-02 DIAGNOSIS — Z720 Tobacco use: Secondary | ICD-10-CM

## 2020-12-05 ENCOUNTER — Telehealth: Payer: Self-pay | Admitting: Cardiology

## 2020-12-05 NOTE — Telephone Encounter (Signed)
Called and spoke with patient gave her both the pregnancy result and the echo result. Patient agreed to having the ETT test in place of the MV since insurance wouldn't cover the MV per telephone note on 11/28/20.  Will route to scheduling to schedule ETT for patient. Instructions were given, patient verbalized understanding and agreed with plan.

## 2020-12-05 NOTE — Telephone Encounter (Signed)
Patient mother called wanting results of echo and wants to know the next step, please call

## 2020-12-12 ENCOUNTER — Encounter (INDEPENDENT_AMBULATORY_CARE_PROVIDER_SITE_OTHER): Payer: Self-pay

## 2020-12-24 ENCOUNTER — Ambulatory Visit: Payer: Medicaid Other | Admitting: Cardiology

## 2021-01-02 ENCOUNTER — Telehealth: Payer: Self-pay

## 2021-01-02 NOTE — Telephone Encounter (Signed)
Unable to get pt via cell phone, called her mother Gunnar Fusi (DPR approved). Wanted to review treadmill test, however, mother reports unsure if pt has a ride at this time. She will call pt to see if she has been able to arrange transportation as they live a good hour away. Pt or mother will call back to confirm before closing at 5pm.

## 2021-01-22 ENCOUNTER — Telehealth: Payer: Self-pay | Admitting: Cardiology

## 2021-01-22 NOTE — Telephone Encounter (Signed)
Patient is scheduled for a GXT tomorrow in our office at 2:00 pm. Attempted to call the patient to go back over the pre-test instructions as below. No answer at her primary #- I left a message to please call back.   - you may eat a light breakfast/ lunch prior to your procedure - no caffeine for 24 hours prior to your test (coffee, tea, soft drinks, or chocolate)  - no smoking/ vaping for 4 hours prior to your test - you may take your regular medications the day of your test - bring any inhalers with you to your test - wear comfortable clothing & tennis/ non-skid shoes to walk on the treadmill

## 2021-01-23 NOTE — Telephone Encounter (Signed)
The patient no showed for her GXT today at 2:00 pm.  She did call in after her appointment time and advised she didn't wake up until 1:50 pm.  She asked to reschedule.  Her next appointment is scheduled for 01/28/21.

## 2021-01-27 ENCOUNTER — Telehealth: Payer: Self-pay | Admitting: Cardiology

## 2021-01-27 NOTE — Telephone Encounter (Signed)
Patient is scheduled for a GXT at 2:00 pm tomorrow in our office. Attempted to call the patient to remind her of her appt date/ time/ pre-test instructions. No answer- No DPR on file to leave a detailed message at her primary #.  I advised she has an appointment at 2:00 pm tomorrow and to please call in the morning at 8:00 am if she cannot keep this appointment.

## 2021-01-28 ENCOUNTER — Other Ambulatory Visit: Payer: Self-pay

## 2021-01-28 ENCOUNTER — Ambulatory Visit (INDEPENDENT_AMBULATORY_CARE_PROVIDER_SITE_OTHER): Payer: Medicaid Other

## 2021-01-28 DIAGNOSIS — R079 Chest pain, unspecified: Secondary | ICD-10-CM | POA: Diagnosis not present

## 2021-01-29 LAB — EXERCISE TOLERANCE TEST
Estimated workload: 11.5 METS
Exercise duration (min): 9 min
Exercise duration (sec): 51 s
MPHR: 200 {beats}/min
Peak HR: 179 {beats}/min
Percent HR: 89 %
RPE: 17
Rest HR: 86 {beats}/min

## 2021-02-17 ENCOUNTER — Encounter: Payer: Self-pay | Admitting: Pediatrics

## 2021-08-10 NOTE — L&D Delivery Note (Signed)
Delivery Note  After a 15 minute 2nd stage, at 4:56 AM a viable female was delivered via Vaginal, Spontaneous (Presentation: Right Occiput Anterior).  APGAR: 7, 8; weight pending. After 1 minute, the cord was clamped and cut. 20 units of pitocin diluted in 500cc LR was infused rapidly IV.  The placenta separated spontaneously and delivered via CCT and maternal pushing effort.  It was inspected and appears to be intact with a 3 VC.   Anesthesia: None Episiotomy: None Lacerations: None Suture Repair:  Est. Blood Loss (mL): 375  Mom to postpartum.  Baby to Couplet care / Skin to Skin.  Joaquim Lai Cresenzo-Dishmon 05/01/2022, 5:20 AM

## 2021-09-18 ENCOUNTER — Other Ambulatory Visit: Payer: Self-pay

## 2021-09-18 ENCOUNTER — Ambulatory Visit (INDEPENDENT_AMBULATORY_CARE_PROVIDER_SITE_OTHER): Payer: Medicaid Other

## 2021-09-18 VITALS — BP 136/89 | HR 106 | Ht 65.0 in | Wt 113.0 lb

## 2021-09-18 DIAGNOSIS — Z3201 Encounter for pregnancy test, result positive: Secondary | ICD-10-CM | POA: Diagnosis not present

## 2021-09-18 DIAGNOSIS — Z3200 Encounter for pregnancy test, result unknown: Secondary | ICD-10-CM

## 2021-09-18 LAB — POCT URINE PREGNANCY: Preg Test, Ur: POSITIVE — AB

## 2021-09-18 NOTE — Progress Notes (Signed)
° °  NURSE VISIT- PREGNANCY CONFIRMATION   SUBJECTIVE:  Yolanda Myers is a 21 y.o. G51P0000 female at [redacted]w[redacted]d by certain LMP of Patient's last menstrual period was 08/07/2021 (approximate). Here for pregnancy confirmation.  Home pregnancy test: positive x 1   She reports  cramping and nausea .  She is not taking prenatal vitamins.    OBJECTIVE:  BP 136/89 (BP Location: Right Arm, Patient Position: Sitting, Cuff Size: Normal)    Pulse (!) 106    Ht 5\' 5"  (1.651 m)    Wt 113 lb (51.3 kg)    LMP 08/07/2021 (Approximate)    BMI 18.80 kg/m   Appears well, in no apparent distress  Results for orders placed or performed in visit on 09/18/21 (from the past 24 hour(s))  POCT urine pregnancy   Collection Time: 09/18/21 10:11 AM  Result Value Ref Range   Preg Test, Ur Positive (A) Negative    ASSESSMENT: Positive pregnancy test, [redacted]w[redacted]d by LMP    PLAN: Schedule for dating ultrasound in 2-3 weeks Prenatal vitamins: plans to begin OTC ASAP   Nausea medicines: not currently needed   OB packet given: Yes  Taela Charbonneau A Rashun Grattan  09/18/2021 10:22 AM

## 2021-10-06 ENCOUNTER — Other Ambulatory Visit: Payer: Self-pay | Admitting: Obstetrics & Gynecology

## 2021-10-06 DIAGNOSIS — O3680X0 Pregnancy with inconclusive fetal viability, not applicable or unspecified: Secondary | ICD-10-CM

## 2021-10-07 ENCOUNTER — Other Ambulatory Visit: Payer: Self-pay

## 2021-10-07 ENCOUNTER — Ambulatory Visit (INDEPENDENT_AMBULATORY_CARE_PROVIDER_SITE_OTHER): Payer: Medicaid Other

## 2021-10-07 DIAGNOSIS — Z3A09 9 weeks gestation of pregnancy: Secondary | ICD-10-CM | POA: Diagnosis not present

## 2021-10-07 DIAGNOSIS — O3680X0 Pregnancy with inconclusive fetal viability, not applicable or unspecified: Secondary | ICD-10-CM

## 2021-10-07 NOTE — Progress Notes (Signed)
[place] 9 wks,single IUP with YS,FHR 178 bpm,CRL 22.17 mm,normal ovaries

## 2021-10-29 ENCOUNTER — Other Ambulatory Visit: Payer: Self-pay | Admitting: Obstetrics & Gynecology

## 2021-10-29 DIAGNOSIS — Z3682 Encounter for antenatal screening for nuchal translucency: Secondary | ICD-10-CM

## 2021-10-30 ENCOUNTER — Encounter: Payer: Self-pay | Admitting: Obstetrics & Gynecology

## 2021-10-30 ENCOUNTER — Other Ambulatory Visit: Payer: Self-pay

## 2021-10-30 ENCOUNTER — Ambulatory Visit: Payer: Medicaid Other | Admitting: *Deleted

## 2021-10-30 ENCOUNTER — Encounter: Payer: Self-pay | Admitting: Advanced Practice Midwife

## 2021-10-30 ENCOUNTER — Ambulatory Visit (INDEPENDENT_AMBULATORY_CARE_PROVIDER_SITE_OTHER): Payer: Medicaid Other | Admitting: Advanced Practice Midwife

## 2021-10-30 ENCOUNTER — Ambulatory Visit (INDEPENDENT_AMBULATORY_CARE_PROVIDER_SITE_OTHER): Payer: Medicaid Other

## 2021-10-30 VITALS — Wt 112.0 lb

## 2021-10-30 DIAGNOSIS — Z3682 Encounter for antenatal screening for nuchal translucency: Secondary | ICD-10-CM

## 2021-10-30 DIAGNOSIS — F419 Anxiety disorder, unspecified: Secondary | ICD-10-CM | POA: Diagnosis not present

## 2021-10-30 DIAGNOSIS — M41125 Adolescent idiopathic scoliosis, thoracolumbar region: Secondary | ICD-10-CM

## 2021-10-30 DIAGNOSIS — Z3A12 12 weeks gestation of pregnancy: Secondary | ICD-10-CM | POA: Diagnosis not present

## 2021-10-30 DIAGNOSIS — Z3400 Encounter for supervision of normal first pregnancy, unspecified trimester: Secondary | ICD-10-CM | POA: Insufficient documentation

## 2021-10-30 DIAGNOSIS — Z3402 Encounter for supervision of normal first pregnancy, second trimester: Secondary | ICD-10-CM

## 2021-10-30 DIAGNOSIS — Z3401 Encounter for supervision of normal first pregnancy, first trimester: Secondary | ICD-10-CM | POA: Diagnosis not present

## 2021-10-30 DIAGNOSIS — O0990 Supervision of high risk pregnancy, unspecified, unspecified trimester: Secondary | ICD-10-CM | POA: Insufficient documentation

## 2021-10-30 DIAGNOSIS — F32A Depression, unspecified: Secondary | ICD-10-CM

## 2021-10-30 LAB — POCT URINALYSIS DIPSTICK OB
Glucose, UA: NEGATIVE
Ketones, UA: NEGATIVE
Leukocytes, UA: NEGATIVE
Nitrite, UA: NEGATIVE
POC,PROTEIN,UA: NEGATIVE

## 2021-10-30 MED ORDER — BLOOD PRESSURE MONITOR MISC: 0 refills | Status: DC

## 2021-10-30 NOTE — Progress Notes (Signed)
? ?INITIAL OBSTETRICAL VISIT ?Patient name: MORGIN HALLS MRN [id number]  Date of birth: [birth year] ?Chief Complaint:   ?Initial Prenatal Visit ? ?History of Present Illness:   ?NAVDEEP FESSENDEN is a 21 y.o. G17P0000  female at [redacted]w[redacted]d by LMP c/w u/s at 8 weeks with an Estimated Date of Delivery: 05/12/22 being seen today for her initial obstetrical visit.   ?Her obstetrical history is significant for first pregnancy.  Hx anxiety and depression, probably stems from "lots of trauma> .  Has not had extensive treatment.  Would like therapy referral.   Has scolosis, went to PT in [place] for a while, loved it, quit going during covid. Would like another referral in [place] ?Today she reports feeling better than earlier in pregnancy.  ? ?  08/15/2019  ?  2:20 PM 03/02/2018  ?  9:48 AM 11/17/2016  ? 12:53 PM 10/22/2016  ?  2:34 PM 09/04/2016  ?  3:39 PM  ?Depression screen PHQ 2/9  ?Decreased Interest 3 1 1 2 1   ?Down, Depressed, Hopeless 1 2 2 1 2   ?PHQ - 2 Score 4 3 3 3 3   ?Altered sleeping 0 1 1 2 2   ?Tired, decreased energy 1 2 3 1 2   ?Change in appetite 1 0 0 0 0  ?Feeling bad or failure about yourself  1 0 2 1 3   ?Trouble concentrating 0 0 1 2 1   ?Moving slowly or fidgety/restless 0 0 0 0 0  ?Suicidal thoughts 1 0 1 2 2   ?PHQ-9 Score 8 6 11 11 13   ?Difficult doing work/chores Somewhat difficult Somewhat difficult     ? ? ?Patient's last menstrual period was 08/05/2021. ?Last pap n/a. ?Review of Systems:   ?Pertinent items are noted in HPI ?Denies cramping/contractions, leakage of fluid, vaginal bleeding, abnormal vaginal discharge w/ itching/odor/irritation, headaches, visual changes, shortness of breath, chest pain, abdominal pain, severe nausea/vomiting, or problems with urination or bowel movements unless otherwise stated above.  ?Pertinent History Reviewed:  ?Reviewed past medical,surgical, social, obstetrical and family history.  ?Reviewed problem list, medications and allergies. ?OB History  ?Gravida Para Term  Preterm AB Living  ?1 0 0 0 0 0  ?SAB IAB Ectopic Multiple Live Births  ?0 0 0 0 0  ?  ?# Outcome Date GA Lbr Len/2nd Weight Sex Delivery Anes PTL Lv  ?1 Current           ? ?Physical Assessment:  ?There were no vitals filed for this visit.There is no height or weight on file to calculate BMI. ? ?     Physical Examination: ? General appearance - well appearing, and in no distress ? Mental status - alert, oriented to person, place, and time ? Psych:  She has a normal mood and affect ? Skin - warm and dry, normal color, no suspicious lesions noted ? Chest - effort normal ? Heart - normal rate and regular rhythm ? Abdomen - soft, nontender ?  ?TODAY'S NT 12+2 wks,measurements c/w dates,CRL 57.58 mm,NB present,NT 1.6 mm,normal ovaries,fundal placenta  ? ?Results for orders placed or performed in visit on 10/30/21 (from the past 24 hour(s))  ?POC Urinalysis Dipstick OB  ? Collection Time: 10/30/21  3:08 PM  ?Result Value Ref Range  ? Color, UA    ? Clarity, UA    ? Glucose, UA Negative Negative  ? Bilirubin, UA    ? Ketones, UA neg   ? Spec Grav, UA    ? Blood, UA trace   ?  pH, UA    ? POC,PROTEIN,UA Negative Negative, Trace, Small (1+), Moderate (2+), Large (3+), 4+  ? Urobilinogen, UA    ? Nitrite, UA neg   ? Leukocytes, UA Negative Negative  ? Appearance    ? Odor    ?  ?Assessment & Plan:  ?1) Low-Risk Pregnancy G1P0000 at [redacted]w[redacted]d with an Estimated Date of Delivery: 05/12/22  ? ?2) Initial OB visit ? ?3) anxiety/depression:  referral to [facility] sent ? ?Meds:  ?Meds ordered this encounter  ?Medications  ? Blood Pressure Monitor MISC  ?  Sig: For regular home bp monitoring during pregnancy  ?  Dispense:  1 each  ?  Refill:  0  ?  Z34.81 ?Please mail to patient  ? ? ?Initial labs obtained ?Continue prenatal vitamins ?Reviewed n/v relief measures and warning s/s to report ?Reviewed recommended weight gain based on pre-gravid BMI ?Encouraged well-balanced diet ?Genetic & carrier screening discussed: requests Panorama, NT/IT,  and Horizon , declines AFP ?Ultrasound discussed; fetal survey: requested ?CCNC completed> form faxed if has or is planning to apply for medicaid ?The nature of [facility] with multiple MDs and other Advanced Practice Providers was explained to patient; also emphasized that fellows, residents, and students are part of our team. ?Doesn't have a  home bp cuff. Rx faxed. Check bp weekly, let us know if >140/90.       ? ?Scarlette Calico Cresenzo-Dishmon ?3:34 PM ? ?

## 2021-10-30 NOTE — Progress Notes (Signed)
[place] 12+2 wks,measurements c/w dates,CRL 57.58 mm,NB present,NT 1.6 mm,normal ovaries,fundal placenta  ?

## 2021-10-30 NOTE — Patient Instructions (Signed)
Roxanne Gates, I greatly value your feedback.  If you receive a survey following your visit with [place] today, we appreciate you taking the time to fill it out.  ?Thanks, ?Cathie Beams, DNP, CNM ? ?[facility] HAS MOVED!!! ?It is now [facility] ?([address] [place], [place] [address]) ?Entrance located off of [address] ?Free 24/7 valet parking  ? Nausea & Vomiting ?Have saltine crackers or pretzels by your bed and eat a few bites before you raise your head out of bed in the morning ?Eat small frequent meals throughout the day instead of large meals ?Drink plenty of fluids throughout the day to stay hydrated, just don't drink a lot of fluids with your meals.  This can make your stomach fill up faster making you feel sick ?Do not brush your teeth right after you eat ?Products with real ginger are good for nausea, like ginger ale and ginger hard candy Make sure it says made with real ginger! ?Sucking on sour candy like lemon heads is also good for nausea ?If your prenatal vitamins make you nauseated, take them at night so you will sleep through the nausea ?Sea Bands ?If you feel like you need medicine for the nausea & vomiting please let us know ?If you are unable to keep any fluids or food down please let us know ? ? Constipation ?Drink plenty of fluid, preferably water, throughout the day ?Eat foods high in fiber such as fruits, vegetables, and grains ?Exercise, such as walking, is a good way to keep your bowels regular ?Drink warm fluids, especially warm prune juice, or decaf coffee ?Eat a 1/2 cup of real oatmeal (not instant), 1/2 cup applesauce, and 1/2-1 cup warm prune juice every day ?If needed, you may take Colace (docusate sodium) stool softener once or twice a day to help keep the stool soft.  ?If you still are having problems with constipation, you may take Miralax once daily as needed to help keep your bowels regular.  ? ?Home Blood Pressure Monitoring for  Patients  ? ?Your provider has recommended that you check your blood pressure (BP) at least once a week at home. If you do not have a blood pressure cuff at home, one will be provided for you. Contact your provider if you have not received your monitor within 1 week.  ? ?Helpful Tips for Accurate Home Blood Pressure Checks  ?Don't smoke, exercise, or drink caffeine 30 minutes before checking your BP ?Use the restroom before checking your BP (a full bladder can raise your pressure) ?Relax in a comfortable upright chair ?Feet on the ground ?Left arm resting comfortably on a flat surface at the level of your heart ?Legs uncrossed ?Back supported ?Sit quietly and don't talk ?Place the cuff on your bare arm ?Adjust snuggly, so that only two fingertips can fit between your skin and the top of the cuff ?Check 2 readings separated by at least one minute ?Keep a log of your BP readings ?For a visual, please reference this diagram: http://ccnc.care/bpdiagram ? ?Provider Name: [facility]     Phone: [phone] ? ?Zone 1: ALL CLEAR  ?Continue to monitor your symptoms:  ?BP reading is less than 140 (top number) or less than 90 (bottom number)  ?No right upper stomach pain ?No headaches or seeing spots ?No feeling nauseated or throwing up ?No swelling in face and hands ? ?Zone 2: CAUTION ?Call your doctor's office for any of the following:  ?  BP reading is greater than 140 (top number) or greater than 90 (bottom number)  ?Stomach pain under your ribs in the middle or right side ?Headaches or seeing spots ?Feeling nauseated or throwing up ?Swelling in face and hands ? ?Zone 3: EMERGENCY  ?Seek immediate medical care if you have any of the following:  ?BP reading is greater than160 (top number) or greater than 110 (bottom number) ?Severe headaches not improving with Tylenol ?Serious difficulty catching your breath ?Any worsening symptoms from Zone 2  ? ? First Trimester of Pregnancy ?The first trimester of pregnancy is from  week 1 until the end of week 12 (months 1 through 3). A week after a sperm fertilizes an egg, the egg will implant on the wall of the uterus. This embryo will begin to develop into a baby. Genes from you and your partner are forming the baby. The female genes determine whether the baby is a boy or a girl. At 6-8 weeks, the eyes and face are formed, and the heartbeat can be seen on ultrasound. At the end of 12 weeks, all the baby's organs are formed.  ?Now that you are pregnant, you will want to do everything you can to have a healthy baby. Two of the most important things are to get good prenatal care and to follow your health care provider's instructions. Prenatal care is all the medical care you receive before the baby's birth. This care will help prevent, find, and treat any problems during the pregnancy and childbirth. ?BODY CHANGES ?Your body goes through many changes during pregnancy. The changes vary from woman to woman.  ?You may gain or lose a couple of pounds at first. ?You may feel sick to your stomach (nauseous) and throw up (vomit). If the vomiting is uncontrollable, call your health care provider. ?You may tire easily. ?You may develop headaches that can be relieved by medicines approved by your health care provider. ?You may urinate more often. Painful urination may mean you have a bladder infection. ?You may develop heartburn as a result of your pregnancy. ?You may develop constipation because certain hormones are causing the muscles that push waste through your intestines to slow down. ?You may develop hemorrhoids or swollen, bulging veins (varicose veins). ?Your breasts may begin to grow larger and become tender. Your nipples may stick out more, and the tissue that surrounds them (areola) may become darker. ?Your gums may bleed and may be sensitive to brushing and flossing. ?Dark spots or blotches (chloasma, mask of pregnancy) may develop on your face. This will likely fade after the baby is  born. ?Your menstrual periods will stop. ?You may have a loss of appetite. ?You may develop cravings for certain kinds of food. ?You may have changes in your emotions from day to day, such as being excited to be pregnant or being concerned that something may go wrong with the pregnancy and baby. ?You may have more vivid and strange dreams. ?You may have changes in your hair. These can include thickening of your hair, rapid growth, and changes in texture. Some women also have hair loss during or after pregnancy, or hair that feels dry or thin. Your hair will most likely return to normal after your baby is born. ?WHAT TO EXPECT AT YOUR PRENATAL VISITS ?During a routine prenatal visit: ?You will be weighed to make sure you and the baby are growing normally. ?Your blood pressure will be taken. ?Your abdomen will be measured to track your baby's growth. ?The fetal  heartbeat will be listened to starting around week 10 or 12 of your pregnancy. ?Test results from any previous visits will be discussed. ?Your health care provider may ask you: ?How you are feeling. ?If you are feeling the baby move. ?If you have had any abnormal symptoms, such as leaking fluid, bleeding, severe headaches, or abdominal cramping. ?If you have any questions. ?Other tests that may be performed during your first trimester include: ?Blood tests to find your blood type and to check for the presence of any previous infections. They will also be used to check for low iron levels (anemia) and Rh antibodies. Later in the pregnancy, blood tests for diabetes will be done along with other tests if problems develop. ?Urine tests to check for infections, diabetes, or protein in the urine. ?An ultrasound to confirm the proper growth and development of the baby. ?An amniocentesis to check for possible genetic problems. ?Fetal screens for spina bifida and Down syndrome. ?You may need other tests to make sure you and the baby are doing well. ?HOME CARE  INSTRUCTIONS  ?Medicines ?Follow your health care provider's instructions regarding medicine use. Specific medicines may be either safe or unsafe to take during pregnancy. ?Take your prenatal vitamins as directed. ?If

## 2021-11-01 LAB — GC/CHLAMYDIA PROBE AMP
Chlamydia trachomatis, NAA: NEGATIVE
Neisseria Gonorrhoeae by PCR: NEGATIVE

## 2021-11-02 LAB — URINE CULTURE: Organism ID, Bacteria: NO GROWTH

## 2021-11-03 LAB — CBC/D/PLT+RPR+RH+ABO+RUBIGG...
Antibody Screen: NEGATIVE
Basophils Absolute: 0 10*3/uL (ref 0.0–0.2)
Basos: 0 %
EOS (ABSOLUTE): 0 10*3/uL (ref 0.0–0.4)
Eos: 1 %
HCV Ab: NONREACTIVE
HIV Screen 4th Generation wRfx: NONREACTIVE
Hematocrit: 37.8 % (ref 34.0–46.6)
Hemoglobin: 13.3 g/dL (ref 11.1–15.9)
Hepatitis B Surface Ag: NEGATIVE
Immature Grans (Abs): 0 10*3/uL (ref 0.0–0.1)
Immature Granulocytes: 0 %
Lymphocytes Absolute: 1.5 10*3/uL (ref 0.7–3.1)
Lymphs: 24 %
MCH: 32 pg (ref 26.6–33.0)
MCHC: 35.2 g/dL (ref 31.5–35.7)
MCV: 91 fL (ref 79–97)
Monocytes Absolute: 0.5 10*3/uL (ref 0.1–0.9)
Monocytes: 8 %
Neutrophils Absolute: 4.3 10*3/uL (ref 1.4–7.0)
Neutrophils: 67 %
Platelets: 203 10*3/uL (ref 150–450)
RBC: 4.16 x10E6/uL (ref 3.77–5.28)
RDW: 12.6 % (ref 11.7–15.4)
RPR Ser Ql: NONREACTIVE
Rh Factor: POSITIVE
Rubella Antibodies, IGG: 1.47 index (ref >0.99)
WBC: 6.4 10*3/uL (ref 3.4–10.8)

## 2021-11-03 LAB — INTEGRATED 1
Crown Rump Length: 57.6 mm
Gest. Age on Collection Date: 12.1 weeks
Maternal Age at EDD: 21.3 yr
Nuchal Translucency (NT): 1.6 mm
Number of Fetuses: 1
PAPP-A Value: 1091.2 ng/mL
Weight: 112 [lb_av]

## 2021-11-03 LAB — HCV INTERPRETATION

## 2021-11-11 ENCOUNTER — Encounter: Payer: Self-pay | Admitting: Advanced Practice Midwife

## 2021-11-13 ENCOUNTER — Encounter (HOSPITAL_BASED_OUTPATIENT_CLINIC_OR_DEPARTMENT_OTHER): Payer: Self-pay | Admitting: Physical Therapy

## 2021-11-13 ENCOUNTER — Ambulatory Visit (HOSPITAL_BASED_OUTPATIENT_CLINIC_OR_DEPARTMENT_OTHER): Payer: Medicaid Other | Attending: Advanced Practice Midwife | Admitting: Physical Therapy

## 2021-11-13 DIAGNOSIS — O99341 Other mental disorders complicating pregnancy, first trimester: Secondary | ICD-10-CM | POA: Diagnosis present

## 2021-11-13 DIAGNOSIS — M41125 Adolescent idiopathic scoliosis, thoracolumbar region: Secondary | ICD-10-CM | POA: Diagnosis not present

## 2021-11-13 DIAGNOSIS — M545 Low back pain, unspecified: Secondary | ICD-10-CM

## 2021-11-13 DIAGNOSIS — M546 Pain in thoracic spine: Secondary | ICD-10-CM | POA: Insufficient documentation

## 2021-11-13 DIAGNOSIS — F419 Anxiety disorder, unspecified: Secondary | ICD-10-CM | POA: Diagnosis not present

## 2021-11-13 DIAGNOSIS — O26891 Other specified pregnancy related conditions, first trimester: Secondary | ICD-10-CM | POA: Diagnosis not present

## 2021-11-13 DIAGNOSIS — M6281 Muscle weakness (generalized): Secondary | ICD-10-CM | POA: Insufficient documentation

## 2021-11-13 DIAGNOSIS — F32A Depression, unspecified: Secondary | ICD-10-CM | POA: Diagnosis not present

## 2021-11-13 DIAGNOSIS — M542 Cervicalgia: Secondary | ICD-10-CM | POA: Insufficient documentation

## 2021-11-13 DIAGNOSIS — Z3A12 12 weeks gestation of pregnancy: Secondary | ICD-10-CM | POA: Diagnosis not present

## 2021-11-13 NOTE — Therapy (Addendum)
?OUTPATIENT PHYSICAL THERAPY THORACOLUMBAR EVALUATION ? ? ?[name] ?MRN: [id number] ?DOB:12-05-2000, 21 y.o., female ?Today's Date: 11/13/2021 ? ? PT End of Session - 11/13/21 1428   ? ? Visit Number 1   ? Number of Visits 12   ? Date for PT Re-Evaluation 02/11/22   ? Authorization Type [place] Medicaid   ? PT Start Time 1430   ? PT Stop Time 1515   ? PT Time Calculation (min) 45 min   ? Activity Tolerance Patient tolerated treatment well   ? Behavior During Therapy [facility] for tasks assessed/performed   ? ?  ?  ? ?  ? ? ?Past Medical History:  ?Diagnosis Date  ? Anxiety   ? Frequent UTI   ? Heavy menses   ? Migraine headache   ? Mild scoliosis   ? ?Past Surgical History:  ?Procedure Laterality Date  ? NO PAST SURGERIES    ? No prior surgeries    ? ?Patient Active Problem List  ? Diagnosis Date Noted  ? Supervision of normal first pregnancy 10/30/2021  ? Lymph node enlargement 06/05/2020  ? Depression, major, single episode, mild (HCC) 08/20/2019  ? Migraine without aura and without status migrainosus, not intractable 04/01/2018  ? Episodic tension-type headache, not intractable 04/01/2018  ? Syncopal seizure (HCC) 04/01/2018  ? Closed head injury with brief loss of consciousness (HCC) 04/01/2018  ? Adolescent idiopathic scoliosis of thoracolumbar region 04/01/2018  ? Nonintractable episodic headache 03/17/2018  ? Syncope 03/17/2018  ? Depression 03/02/2018  ? Curvature of spine 03/02/2018  ? Moderate single current episode of major depressive disorder (HCC) 07/08/2016  ? ACHILLES TENDINITIS 11/10/2009  ? ? ?PCP: Jason Coop, FNP ? ?REFERRING PROVIDER: Wandra Mannan* ? ?REFERRING DIAG:  ?Z3A.12 (ICD-10-CM) - [redacted] weeks gestation of pregnancy  ?F41.9,F32.A (ICD-10-CM) - Anxiety and depression  ?M41.125 (ICD-10-CM) - Adolescent idiopathic scoliosis of thoracolumbar region  ? ? ?THERAPY DIAG:  ?Pain, lumbar region -  ? ?Cervicalgia -  ? ?Pain in thoracic spine -  ? ?Muscle weakness  (generalized) ? ?ONSET DATE: 2019 ? ?SUBJECTIVE:                                                                                                                                                                                          ? ?SUBJECTIVE STATEMENT: ?Pt states she is [redacted] weeks pregnant at this time.  ? ?Pt reports she is having mid/low back pain due to her history of scoliotic curves. She has received PT before for this in the past and would like to start again.  Pt states she was doing PT stretching, straight  leg raises, and "pressure point massage" that helped a lot. Pt states that the pain has been going on for 4 years. She feels the pain into her neck and down into her ribs. She states that her legs and hip will also be very stiff as well after standing. She feels that back will "lock up" while standing while trying to stand at her previous job. Pt has trouble sleeping sleeping on the L with pillow which might make it worse. She did gymnastics in the past and felt like she rotated her discs.  ? ?[place] but is interested in [facility] PT and potentially aquatic.  ? ? ? ?  ?PERTINENT HISTORY:  ?Hyperthyroidism, depression,  ? ?PAIN:  ?Are you having pain? Yes: NPRS scale: 4/10 ?Pain location: shoulders, mid back ?Pain description: dull, aching pain, tightness ?Aggravating factors: standing for too long, bending  ?Relieving factors: stretching, movement ? ? ?PRECAUTIONS: Other: Pregancy- avoid supine laying, no e-stim ? ?WEIGHT BEARING RESTRICTIONS No ? ?FALLS:  ?Has patient fallen in last 6 months? No ? ?LIVING ENVIRONMENT: ?Lives with: lives alone ?Lives in: House/apartment ?Stairs: 1 step to enter ? ? ?OCCUPATION: Not currently working ? ?PLOF: Independent ? ?PATIENT GOALS : Pt states she would like to loosen up the muscles in her back and relieve her pain.  ? ? ?OBJECTIVE:  ? ?DIAGNOSTIC FINDINGS:  ? ?Mild convex rightward scoliosis of the lower thoracic spine has its ?apex at the T8  vertebral body. Measuring from the T5 inferior ?endplate to the T11 pedicles, curvature measures 15.1 degrees convex ?right. ?  ?Compensatory convex leftward scoliosis of the lumbar spine noted ?with apex at the L1 vertebral body. Measuring from the T12 pedicles ?to the L3 pedicles, curvature is 22.7 degrees convex leftward. ?  ?IMPRESSION: ?Thoracolumbar scoliosis as above. ? ?PATIENT SURVEYS:  ?Oswestry Score: 15 / 50 or 30 % ? ?SCREENING FOR RED FLAGS: ?Bowel or bladder incontinence: No ?Spinal tumors: No ?Cauda equina syndrome: No ? ?COGNITION: ? Overall cognitive status: Difficulty to assess   ?  ?SENSATION: ?WFL ? ?MUSCLE LENGTH: ?Hamstrings: >20 deg limitation in supine 90/90 ? ?POSTURE:  ?Prefers to sit in L SB against chair arm, R scoliotic curve present with standing observation ? ?PALPATION: ?Signficant TTP of L T/S and lumbar paraspinals ?TTP into cervical paraspinals  ? ?LUMBAR ROM:  ? ?Active  A/PROM  ?11/13/2021  ?Flexion 50% with R deviation  ?Extension WFL with L devation; R SIJ pain  ?Right lateral flexion WFL  ?Left lateral flexion 75%  ?Right rotation WFL p! Into shoulder blade  ?Left rotation WFL p! Into shoulder blade  ? (Blank rows = not tested) ? ?LE ROM: WFL limits throughtout but with pain into hip flexion, ER, and extension at the L SIJ and  ? ?LE MMT: ? ?MMT Right ?11/13/2021 Left ?11/13/2021  ?Hip flexion 4/5 p! 4/5 p!  ?Hip extension    ?Hip abduction 4/5 4/5  ?Hip adduction 4/5 4/5  ?Hip internal rotation    ?Hip external rotation 4/5 4/5  ? (Blank rows = not tested) ? ?LUMBAR SPECIAL TESTS:  ?Slump test: Negative, SI Compression/distraction test: Positive, FABER test: Positive, and Trendelenburg sign: Positive ? ? ?GAIT: ?Distance walked: 3025ft ?Assistive device utilized: None ?Level of assistance: Complete Independence ?Comments: slight R SB in walking, lateral sway ? ? ? ?TODAY'S TREATMENT  ? ?Exercises ?- Clamshell  - 1 x daily - 7 x weekly - 2 sets - 10 reps ?- Supine Bridge with  Resistance Band  - 1 x daily - 7 x weekly - 2 sets - 10 reps ?- Cat Cow  - 1 x daily - 7 x weekly - 2 sets - 10 reps ?- Seated Piriformis Stretch  - 2 x daily - 7 x weekly - 1 sets - 3 reps - 30 hold ? ? ?PATIENT EDUCATION:  ?Education details: pregnancy affects on back pain, abuse and neglect resources, precautions, diagnosis, prognosis, anatomy, exercise progression, DOMS expectations, muscle firing,  envelope of function, HEP, POC ? ?Person educated: Patient ?Education method: Explanation, Demonstration, Tactile cues, Verbal cues, and Handouts ?Education comprehension: verbalized understanding, returned demonstration, verbal cues required, and tactile cues required ? ? ?If you have concerns about your personal safety or are in a situation of abuse, below are resources you can access. ? ?[email or web address] ? ?[facility] ?[address] ?[place], [place] [address] ?[phone]) Main ?[phone] Direct ? ?[facility] ?[place] ?[address] ?[place], [place] [address] ?[phone] ?[phone] Direct  ? ?Walk-In Appointment Hours ?Monday through Friday ?8:30 to 4:30 pm ? ?If you are in immediate danger, please call 911, or [facility] 24 hour Crisis Hotline at [phone].  ? ? ?HOME EXERCISE PROGRAM: ?Access Code: [email or web address] ?URL: [email or web address] ?Date: 11/13/2021 ?Prepared by: Zebedee Iba ? ?ASSESSMENT: ? ?CLINICAL IMPRESSION: ?Patient is a 21 y.o. female who was seen today for physical therapy evaluation and treatment for cc of LBP and mid back pain during pregnancy. Pt is 14 weeks at this time. Pt's s/s appear consistent with history of scoliosis and mechanical back pain due to soft tissue and joint restriction. Pt also appears to have cervical and shoulder girdle pain related to muscle tightness. Pt's pain is moderately sensitive and irritable with  movement. Due to pt's pregnancy, she will likely need to address lumbopelvic stability to prevent worsening of LBP and hip pain. Pt may be able to participate in cold pool aquatic therapy as an option for strengthening and

## 2021-11-13 NOTE — Addendum Note (Signed)
Addended by: Zebedee Iba on: 11/13/2021 03:57 PM ? ? Modules accepted: Orders ? ?

## 2021-11-17 ENCOUNTER — Encounter: Payer: Self-pay | Admitting: Advanced Practice Midwife

## 2021-11-27 ENCOUNTER — Ambulatory Visit (HOSPITAL_BASED_OUTPATIENT_CLINIC_OR_DEPARTMENT_OTHER): Payer: Medicaid Other | Admitting: Physical Therapy

## 2021-11-27 ENCOUNTER — Encounter (HOSPITAL_BASED_OUTPATIENT_CLINIC_OR_DEPARTMENT_OTHER): Payer: Self-pay | Admitting: Physical Therapy

## 2021-11-27 ENCOUNTER — Ambulatory Visit (INDEPENDENT_AMBULATORY_CARE_PROVIDER_SITE_OTHER): Payer: Medicaid Other | Admitting: Advanced Practice Midwife

## 2021-11-27 VITALS — BP 111/62 | HR 91 | Wt 116.0 lb

## 2021-11-27 DIAGNOSIS — Z3402 Encounter for supervision of normal first pregnancy, second trimester: Secondary | ICD-10-CM

## 2021-11-27 DIAGNOSIS — Z1379 Encounter for other screening for genetic and chromosomal anomalies: Secondary | ICD-10-CM

## 2021-11-27 DIAGNOSIS — Z3A16 16 weeks gestation of pregnancy: Secondary | ICD-10-CM

## 2021-11-27 DIAGNOSIS — M545 Low back pain, unspecified: Secondary | ICD-10-CM

## 2021-11-27 DIAGNOSIS — M542 Cervicalgia: Secondary | ICD-10-CM

## 2021-11-27 DIAGNOSIS — L299 Pruritus, unspecified: Secondary | ICD-10-CM

## 2021-11-27 DIAGNOSIS — M6281 Muscle weakness (generalized): Secondary | ICD-10-CM

## 2021-11-27 DIAGNOSIS — M533 Sacrococcygeal disorders, not elsewhere classified: Secondary | ICD-10-CM

## 2021-11-27 DIAGNOSIS — M546 Pain in thoracic spine: Secondary | ICD-10-CM

## 2021-11-27 DIAGNOSIS — O26891 Other specified pregnancy related conditions, first trimester: Secondary | ICD-10-CM | POA: Diagnosis not present

## 2021-11-27 DIAGNOSIS — Z363 Encounter for antenatal screening for malformations: Secondary | ICD-10-CM

## 2021-11-27 MED ORDER — COMFORT FIT MATERNITY SUPP MED MISC: 1.0000 | 0 refills | Status: DC | PRN

## 2021-11-27 NOTE — Progress Notes (Signed)
? ?  LOW-RISK PREGNANCY VISIT ?[name] MRN [id number]  Date of birth: 05-14-2001 ?Chief Complaint:   ?Routine Prenatal Visit ? ?History of Present Illness:   ?Yolanda Myers is a 21 y.o. G60P0000 female at [redacted]w[redacted]d with an Estimated Date of Delivery: 05/12/22 being seen today for ongoing management of a low-risk pregnancy.  ?Today she reports occ itchy feeling .  Seeing PT for scoliosis, likes it.  Now having some pain.in her "tailbone" area, along w/some tingling/numbness. [facility] for [facility], plans to set up an appointment. Contractions: Not present. Vag. Bleeding: None.  Movement: Present. denies leaking of fluid. ?Review of Systems:   ?Pertinent items are noted in HPI ?Denies abnormal vaginal discharge w/ itching/odor/irritation, headaches, visual changes, shortness of breath, chest pain, abdominal pain, severe nausea/vomiting, or problems with urination or bowel movements unless otherwise stated above. ?Pertinent History Reviewed:  ?Reviewed past medical,surgical, social, obstetrical and family history.  ?Reviewed problem list, medications and allergies. ?Physical Assessment:  ? ?Vitals:  ? 11/27/21 1016  ?BP: 111/62  ?Pulse: 91  ?Weight: 116 lb (52.6 kg)  ?Body mass index is 19.3 kg/m?. ?  ?     Physical Examination:  ? General appearance: Well appearing, and in no distress ? Mental status: Alert, oriented to person, place, and time ? Skin: Warm & dry ? Cardiovascular: Normal heart rate noted ? Respiratory: Normal respiratory effort, no distress ? Abdomen: Soft, gravid, nontender ? Pelvic: Cervical exam performed        ? Extremities: Edema: None ? ?Fetal Status:     Movement: Present   ? ?Chaperone: n/a   ? ?No results found for this or any previous visit (from the past 24 hour(s)).  ?Assessment & Plan:  ?1) Low-risk pregnancy G1P0000 at [redacted]w[redacted]d with an Estimated Date of Delivery: 05/12/22  ? ?2) Sacral pain, pelvic floor PT referral made ? ?3)  Some sole/palm  pruritis:  check Bile salts ?  ?Meds:  ?Meds ordered this encounter  ?Medications  ? Elastic Bandages & Supports (COMFORT FIT MATERNITY SUPP MED) MISC  ?  Sig: 1 each by Does not apply route as needed (apply to lower abdomen).  ?  Dispense:  1 each  ?  Refill:  0  ?  Order Specific Question:   Supervising Provider  ?  Answer:   Duane Lope H [[id number]]  ? ?Labs/procedures today: 2nd IT ? ?Plan:  Continue routine obstetrical care  ?Next visit: prefers will be in person for anatomy scan    ? ?Reviewed: Preterm labor symptoms and general obstetric precautions including but not limited to vaginal bleeding, contractions, leaking of fluid and fetal movement were reviewed in detail with the patient.  All questions were answered. Has home bp cuff. Check bp weekly, let us know if >140/90.  ? ?Follow-up: Return in about 3 weeks (around 12/18/2021) for TT:[id number], LROB. ? ?Orders Placed This Encounter  ?Procedures  ? US OB Comp + 14 Wk  ? INTEGRATED 2  ? Bile acids, total  ? AMB referral to rehabilitation  ? ?Jacklyn Shell DNP, CNM ?11/27/2021 ?12:35 PM ? ?

## 2021-11-27 NOTE — Patient Instructions (Signed)
Yolanda Myers, I greatly value your feedback.  If you receive a survey following your visit with [place] today, we appreciate you taking the time to fill it out.  ?Thanks, ?Nigel Berthold, CNM ? ? ? ? ?[place]!!! ?It is now [facility] [place] at [facility] ?([address] [place], [place] [address]) ?Entrance located off of [address] ?Free 24/7 valet parking  ? ?Go to [email or web address] to register for FREE online childbirth classes  ? ? ?Second Trimester of Pregnancy ?The second trimester is from week 14 through week 27 (months 4 through 6). The second trimester is often a time when you feel your best. Your body has adjusted to being pregnant, and you begin to feel better physically. Usually, morning sickness has lessened or quit completely, you may have more energy, and you may have an increase in appetite. The second trimester is also a time when the fetus is growing rapidly. At the end of the sixth month, the fetus is about 9 inches long and weighs about 1? pounds. You will likely begin to feel the baby move (quickening) between 16 and 20 weeks of pregnancy. ?Body changes during your second trimester ?Your body continues to go through many changes during your second trimester. The changes vary from woman to woman. ?Your weight will continue to increase. You will notice your lower abdomen bulging out. ?You may begin to get stretch marks on your hips, abdomen, and breasts. ?You may develop headaches that can be relieved by medicines. The medicines should be approved by your health care provider. ?You may urinate more often because the fetus is pressing on your bladder. ?You may develop or continue to have heartburn as a result of your pregnancy. ?You may develop constipation because certain hormones are causing the muscles that push waste through your intestines to slow down. ?You may develop hemorrhoids or swollen, bulging veins (varicose veins). ?You may have back pain. This  is caused by: ?Weight gain. ?Pregnancy hormones that are relaxing the joints in your pelvis. ?A shift in weight and the muscles that support your balance. ?Your breasts will continue to grow and they will continue to become tender. ?Your gums may bleed and may be sensitive to brushing and flossing. ?Dark spots or blotches (chloasma, mask of pregnancy) may develop on your face. This will likely fade after the baby is born. ?A dark line from your belly button to the pubic area (linea nigra) may appear. This will likely fade after the baby is born. ?You may have changes in your hair. These can include thickening of your hair, rapid growth, and changes in texture. Some women also have hair loss during or after pregnancy, or hair that feels dry or thin. Your hair will most likely return to normal after your baby is born. ? ?What to expect at prenatal visits ?During a routine prenatal visit: ?You will be weighed to make sure you and the fetus are growing normally. ?Your blood pressure will be taken. ?Your abdomen will be measured to track your baby's growth. ?The fetal heartbeat will be listened to. ?Any test results from the previous visit will be discussed. ? ?Your health care provider may ask you: ?How you are feeling. ?If you are feeling the baby move. ?If you have had any abnormal symptoms, such as leaking fluid, bleeding, severe headaches, or abdominal cramping. ?If you are using any tobacco products, including cigarettes, chewing tobacco, and electronic cigarettes. ?If you have any questions. ? ?Other tests  that may be performed during your second trimester include: ?Blood tests that check for: ?Low iron levels (anemia). ?High blood sugar that affects pregnant women (gestational diabetes) between 68 and 28 weeks. ?Rh antibodies. This is to check for a protein on red blood cells (Rh factor). ?Urine tests to check for infections, diabetes, or protein in the urine. ?An ultrasound to confirm the proper growth and  development of the baby. ?An amniocentesis to check for possible genetic problems. ?Fetal screens for spina bifida and Down syndrome. ?HIV (human immunodeficiency virus) testing. Routine prenatal testing includes screening for HIV, unless you choose not to have this test. ? ?Follow these instructions at home: ?Medicines ?Follow your health care provider's instructions regarding medicine use. Specific medicines may be either safe or unsafe to take during pregnancy. ?Take a prenatal vitamin that contains at least 600 micrograms (mcg) of folic acid. ?If you develop constipation, try taking a stool softener if your health care provider approves. ?Eating and drinking ?Eat a balanced diet that includes fresh fruits and vegetables, whole grains, good sources of protein such as meat, eggs, or tofu, and low-fat dairy. Your health care provider will help you determine the amount of weight gain that is right for you. ?Avoid raw meat and uncooked cheese. These carry germs that can cause birth defects in the baby. ?If you have low calcium intake from food, talk to your health care provider about whether you should take a daily calcium supplement. ?Limit foods that are high in fat and processed sugars, such as fried and sweet foods. ?To prevent constipation: ?Drink enough fluid to keep your urine clear or pale yellow. ?Eat foods that are high in fiber, such as fresh fruits and vegetables, whole grains, and beans. ?Activity ?Exercise only as directed by your health care provider. Most women can continue their usual exercise routine during pregnancy. Try to exercise for 30 minutes at least 5 days a week. Stop exercising if you experience uterine contractions. ?Avoid heavy lifting, wear low heel shoes, and practice good posture. ?A sexual relationship may be continued unless your health care provider directs you otherwise. ?Relieving pain and discomfort ?Wear a good support bra to prevent discomfort from breast tenderness. ?Take  warm sitz baths to soothe any pain or discomfort caused by hemorrhoids. Use hemorrhoid cream if your health care provider approves. ?Rest with your legs elevated if you have leg cramps or low back pain. ?If you develop varicose veins, wear support hose. Elevate your feet for 15 minutes, 3-4 times a day. Limit salt in your diet. ?Prenatal Care ?Write down your questions. Take them to your prenatal visits. ?Keep all your prenatal visits as told by your health care provider. This is important. ?Safety ?Wear your seat belt at all times when driving. ?Make a list of emergency phone numbers, including numbers for family, friends, the hospital, and police and fire departments. ?General instructions ?Ask your health care provider for a referral to a local prenatal education class. Begin classes no later than the beginning of month 6 of your pregnancy. ?Ask for help if you have counseling or nutritional needs during pregnancy. Your health care provider can offer advice or refer you to specialists for help with various needs. ?Do not use hot tubs, steam rooms, or saunas. ?Do not douche or use tampons or scented sanitary pads. ?Do not cross your legs for long periods of time. ?Avoid cat litter boxes and soil used by cats. These carry germs that can cause birth defects in the baby  and possibly loss of the fetus by miscarriage or stillbirth. ?Avoid all smoking, herbs, alcohol, and unprescribed drugs. Chemicals in these products can affect the formation and growth of the baby. ?Do not use any products that contain nicotine or tobacco, such as cigarettes and e-cigarettes. If you need help quitting, ask your health care provider. ?Visit your dentist if you have not gone yet during your pregnancy. Use a soft toothbrush to brush your teeth and be gentle when you floss. ?Contact a health care provider if: ?You have dizziness. ?You have mild pelvic cramps, pelvic pressure, or nagging pain in the abdominal area. ?You have persistent  nausea, vomiting, or diarrhea. ?You have a bad smelling vaginal discharge. ?You have pain when you urinate. ?Get help right away if: ?You have a fever. ?You are leaking fluid from your vagina. ?You have spotting
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
## 2021-11-29 LAB — INTEGRATED 2
AFP MoM: 0.63
Alpha-Fetoprotein: 24.7 ng/mL
Crown Rump Length: 57.6 mm
DIA MoM: 1.49
DIA Value: 277.7 pg/mL
Estriol, Unconjugated: 0.86 ng/mL
Gest. Age on Collection Date: 12.1 weeks
Gestational Age: 16.1 weeks
Maternal Age at EDD: 21.3 yr
Nuchal Translucency (NT): 1.6 mm
Nuchal Translucency MoM: 1.25
Number of Fetuses: 1
PAPP-A MoM: 0.88
PAPP-A Value: 1091.2 ng/mL
Test Results:: NEGATIVE
Weight: 112 [lb_av]
Weight: 112 [lb_av]
hCG MoM: 0.96
hCG Value: 40.4 IU/mL
uE3 MoM: 0.85

## 2021-12-02 ENCOUNTER — Encounter: Payer: Self-pay | Admitting: Advanced Practice Midwife

## 2021-12-03 ENCOUNTER — Telehealth: Payer: Self-pay | Admitting: Women's Health

## 2021-12-03 DIAGNOSIS — Z3401 Encounter for supervision of normal first pregnancy, first trimester: Secondary | ICD-10-CM

## 2021-12-03 MED ORDER — PRENATAL VITAMIN 27-0.8 MG PO TABS: 1.0000 | ORAL_TABLET | Freq: Every day | ORAL | 12 refills | Status: DC

## 2021-12-03 NOTE — Telephone Encounter (Signed)
Will rx PNV 

## 2021-12-03 NOTE — Telephone Encounter (Signed)
Patient calling wanting to know if she could get a script for prental vitiams sent to [facility] [place]  ?

## 2021-12-03 NOTE — Addendum Note (Signed)
Addended by: Cyril Mourning A on: 12/03/2021 04:54 PM ? ? Modules accepted: Orders ? ?

## 2021-12-05 MED ORDER — COMPLETENATE 29-1 MG PO CHEW
CHEWABLE_TABLET | ORAL | 3 refills | Status: DC
Start: 2021-12-05 — End: 2022-01-30

## 2021-12-15 ENCOUNTER — Other Ambulatory Visit: Payer: Medicaid Other

## 2021-12-15 ENCOUNTER — Encounter: Payer: Self-pay | Admitting: Advanced Practice Midwife

## 2021-12-15 ENCOUNTER — Encounter: Payer: Medicaid Other | Admitting: Women's Health

## 2021-12-17 ENCOUNTER — Encounter (HOSPITAL_BASED_OUTPATIENT_CLINIC_OR_DEPARTMENT_OTHER): Payer: Self-pay | Admitting: Physical Therapy

## 2021-12-17 ENCOUNTER — Ambulatory Visit (HOSPITAL_BASED_OUTPATIENT_CLINIC_OR_DEPARTMENT_OTHER): Payer: Medicaid Other | Attending: Advanced Practice Midwife | Admitting: Physical Therapy

## 2021-12-17 DIAGNOSIS — M545 Low back pain, unspecified: Secondary | ICD-10-CM | POA: Insufficient documentation

## 2021-12-18 ENCOUNTER — Other Ambulatory Visit: Payer: Medicaid Other

## 2021-12-22 ENCOUNTER — Ambulatory Visit (HOSPITAL_BASED_OUTPATIENT_CLINIC_OR_DEPARTMENT_OTHER): Payer: Medicaid Other | Admitting: Physical Therapy

## 2021-12-24 ENCOUNTER — Ambulatory Visit (HOSPITAL_BASED_OUTPATIENT_CLINIC_OR_DEPARTMENT_OTHER): Payer: Medicaid Other | Admitting: Physical Therapy

## 2021-12-24 ENCOUNTER — Encounter: Payer: Medicaid Other | Admitting: Advanced Practice Midwife

## 2021-12-29 ENCOUNTER — Encounter: Payer: Medicaid Other | Admitting: Women's Health

## 2021-12-30 ENCOUNTER — Ambulatory Visit (INDEPENDENT_AMBULATORY_CARE_PROVIDER_SITE_OTHER): Payer: Medicaid Other | Admitting: Women's Health

## 2021-12-30 ENCOUNTER — Other Ambulatory Visit (HOSPITAL_COMMUNITY)
Admission: RE | Admit: 2021-12-30 | Discharge: 2021-12-30 | Disposition: A | Discharge status: Home / Self Care | Payer: Medicaid Other | Source: Ambulatory Visit | Attending: Women's Health | Admitting: Women's Health

## 2021-12-30 ENCOUNTER — Encounter (HOSPITAL_BASED_OUTPATIENT_CLINIC_OR_DEPARTMENT_OTHER): Payer: Self-pay | Admitting: Physical Therapy

## 2021-12-30 ENCOUNTER — Encounter: Payer: Self-pay | Admitting: Women's Health

## 2021-12-30 ENCOUNTER — Ambulatory Visit (HOSPITAL_BASED_OUTPATIENT_CLINIC_OR_DEPARTMENT_OTHER): Payer: Medicaid Other | Admitting: Physical Therapy

## 2021-12-30 VITALS — BP 113/73 | HR 83 | Wt 118.0 lb

## 2021-12-30 DIAGNOSIS — M545 Low back pain, unspecified: Secondary | ICD-10-CM

## 2021-12-30 DIAGNOSIS — R300 Dysuria: Secondary | ICD-10-CM

## 2021-12-30 DIAGNOSIS — N898 Other specified noninflammatory disorders of vagina: Secondary | ICD-10-CM | POA: Diagnosis present

## 2021-12-30 DIAGNOSIS — R102 Pelvic and perineal pain: Secondary | ICD-10-CM

## 2021-12-30 DIAGNOSIS — Z3402 Encounter for supervision of normal first pregnancy, second trimester: Secondary | ICD-10-CM

## 2021-12-30 DIAGNOSIS — O26892 Other specified pregnancy related conditions, second trimester: Secondary | ICD-10-CM | POA: Diagnosis present

## 2021-12-30 DIAGNOSIS — Z3A21 21 weeks gestation of pregnancy: Secondary | ICD-10-CM

## 2021-12-30 LAB — POCT URINALYSIS DIPSTICK OB
Blood, UA: NEGATIVE
Glucose, UA: NEGATIVE
Ketones, UA: NEGATIVE
Leukocytes, UA: NEGATIVE
Nitrite, UA: NEGATIVE
POC,PROTEIN,UA: NEGATIVE

## 2021-12-30 NOTE — Progress Notes (Signed)
Work-in LOW-RISK PREGNANCY VISIT [name] MRN [id number]  Date of birth: Jul 23, 2001 Chief Complaint:   Routine Prenatal Visit (Having pelvic pain)  History of Present Illness:   Yolanda Myers is a 21 y.o. G73P0000 female at [redacted]w[redacted]d with an Estimated Date of Delivery: 05/12/22 being seen today for ongoing management of a low-risk pregnancy.   Today she is being seen as a work in for report of sharp intermittent lower pelvic and RLQ pain x 2-3wks, worse last night/this morning. Lasts about 5 seconds, more intense this am. Walked long distance last night. Hurts worse after baby moving. Denies fever/chills/n/v, LOF, vag bleeding. Some discharge, no itching/odor/irritation. Trouble sleeping, but reports she sleeps 12hrs, then wakes up for awhile, then takes a nap. Pain in chest when laying down. Was having itching, but has resolved. Some pain in bladder when waking up. Hurts when voiding in morning, and occ during day.  Contractions: Not present. Vag. Bleeding: None.  Movement: Present. denies leaking of fluid.     10/30/2021    3:35 PM 08/15/2019    2:20 PM 03/02/2018    9:48 AM 11/17/2016   12:53 PM 10/22/2016    2:34 PM  Depression screen PHQ 2/9  Decreased Interest 1 3 1 1 2   Down, Depressed, Hopeless 1 1 2 2 1   PHQ - 2 Score 2 4 3 3 3   Altered sleeping 2 0 1 1 2   Tired, decreased energy 1 1 2 3 1   Change in appetite 0 1 0 0 0  Feeling bad or failure about yourself  1 1 0 2 1  Trouble concentrating 0 0 0 1 2  Moving slowly or fidgety/restless 0 0 0 0 0  Suicidal thoughts 0 1 0 1 2  PHQ-9 Score 6 8 6 11 11   Difficult doing work/chores  Somewhat difficult Somewhat difficult          10/30/2021    3:36 PM 08/15/2019    2:21 PM  GAD 7 : Generalized Anxiety Score  Nervous, Anxious, on Edge 0 2  Control/stop worrying 0 1  Worry too much - different things 0 0  Trouble relaxing 0 0  Restless 0 1  Easily annoyed or irritable 2 1  Afraid - awful might happen 0 0   Total GAD 7 Score 2 5  Anxiety Difficulty  Somewhat difficult      Review of Systems:   Pertinent items are noted in HPI Denies abnormal vaginal discharge w/ itching/odor/irritation, headaches, visual changes, shortness of breath, chest pain, abdominal pain, severe nausea/vomiting, or problems with urination or bowel movements unless otherwise stated above. Pertinent History Reviewed:  Reviewed past medical,surgical, social, obstetrical and family history.  Reviewed problem list, medications and allergies. Physical Assessment:   Vitals:   12/30/21 0851  BP: 113/73  Pulse: 83  Weight: 118 lb (53.5 kg)  Body mass index is 19.64 kg/m.        Physical Examination:   General appearance: Well appearing, and in no distress  Mental status: Alert, oriented to person, place, and time  Skin: Warm & dry  Cardiovascular: Normal heart rate noted  Respiratory: Normal respiratory effort, no distress  Abdomen: Soft, gravid, nontender  Pelvic:  spec exam: cx visually long & closed, scant d/c          Extremities: Edema: None  Fetal Status: Fetal Heart Rate (bpm): 149   Movement: Present    Chaperone:   Results for orders  placed or performed in visit on 12/30/21 (from the past 24 hour(s))  POC Urinalysis Dipstick OB   Collection Time: 12/30/21  8:53 AM  Result Value Ref Range   Color, UA     Clarity, UA     Glucose, UA Negative Negative   Bilirubin, UA     Ketones, UA neg    Spec Grav, UA     Blood, UA neg    pH, UA     POC,PROTEIN,UA Negative Negative, Trace, Small (1+), Moderate (2+), Large (3+), 4+   Urobilinogen, UA     Nitrite, UA neg    Leukocytes, UA Negative Negative   Appearance     Odor      Assessment & Plan:  1) Low-risk pregnancy G1P0000 at [redacted]w[redacted]d with an Estimated Date of Delivery: 05/12/22   2) Intermittent low pelvic/RLQ pain, sounds more like MSK pain, ligaments, etc. Cx long/closed, denies fever/chills/n/v-so appendicitis low suspicion. Stay  hydrated, move slowly, takes breaks as needed  3) Vag d/c> CV swab  4) Occ dysuria> send urine cx  5) Pain in chest when laying down> try TUMS, sleeping inclined   Meds: No orders of the defined types were placed in this encounter.  Labs/procedures today: spec exam, CV swab, and urine culture  Plan:  Continue routine obstetrical care  Next visit: prefers will be in person for u/s     Reviewed: Preterm labor symptoms and general obstetric precautions including but not limited to vaginal bleeding, contractions, leaking of fluid and fetal movement were reviewed in detail with the patient.  All questions were answered.  Follow-up: Return for As scheduled.  Future Appointments  Date Time Provider Department Center  12/30/2021  2:30 PM Army Fossa, PT [facility]  01/12/2022 10:45 AM [facility] [place] [facility] None  01/12/2022 11:30 AM Cheral Marker, CNM [facility]    Orders Placed This Encounter  Procedures   Urine Culture   POC Urinalysis Dipstick OB   Cheral Marker CNM, [facility] 12/30/2021 9:24 AM

## 2021-12-30 NOTE — Patient Instructions (Addendum)
[place], thank you for choosing our office today! We appreciate the opportunity to meet your healthcare needs. You may receive a short survey by mail, e-mail, or through [employer]. If you are happy with your care we would appreciate if you could take just a few minutes to complete the survey questions. We read all of your comments and take your feedback very seriously. Thank you again for choosing our office.  [facility] for [employer] [facility] Team at [facility] at [facility] ([address] [place], [place] [address]) Entrance C, located off of [address] 24/7 valet parking   Tips to Help You Sleep Better:  Get into a bedtime routine, try to do the same thing every night before going to bed to try to help your body wind down Warm baths Avoid caffeine for at least 3 hours before going to sleep  Keep your room at a slightly cooler temperature, can try running a fan Turn off TV, lights, phone, electronics Lots of pillows if needed to help you get comfortable Lavender scented items can help you sleep. You can place lavender essential oil on a cotton ball and place under your pillowcase, or place in a diffuser. [employer] has a lavender scented sleep line (plug-ins, sprays, etc). Look in the pillow aisle for lavender scented pillows.  If none of the above things help, you can try 1/2 to 1 tablet of benadryl, unisom, or tylenol pm. Do not take this every night, only when you really need it.   Go to [email or web address] to register for FREE online childbirth classes  Call the office [phone]) or go to [facility] if: You begin to severe cramping Your water breaks.  Sometimes it is a big gush of fluid, sometimes it is just a trickle that keeps getting your panties wet or running down your legs You have vaginal bleeding.  It is normal to have a small amount of spotting if your cervix was checked.   [facility] Pediatricians/Family Doctors [facility]):  [address] [place]lette Ribas, [phone]           [facility]: [address], [phone]                [facility] Medicine [facility]): [address], [phone] (call to ask if accepting patients) [facility] Department: [address], [place], [phone]    [facility] Pediatricians/Family Doctors [facility]): [id number], [phone] [facility]: [address] [place], [phone] [facility]: [address], [phone]  [facility] Doctors  [facility]): [phone] [facility]: [address], [phone]   [facility] Doctors [facility]: [address], [phone]  [facility] Doctors  [facility]: [phone], [phone]  Home Blood Pressure Monitoring for Patients   Your provider has recommended that you check your blood pressure (BP) at least once a week at home. If you do not have a blood pressure cuff at home, one will be provided for you. Contact your provider if you have not received your monitor within 1 week.   Helpful Tips for Accurate Home Blood Pressure Checks  Don't smoke, exercise, or drink caffeine 30 minutes before checking your BP Use the restroom before checking your BP (a full bladder can raise your pressure) Relax in a comfortable upright chair Feet on the ground Left  arm resting comfortably on a flat surface at the level of your heart Legs uncrossed Back supported Sit quietly and don't talk Place the cuff on your bare arm Adjust snuggly, so that only two fingertips can fit between your skin and the top of the cuff Check 2 readings separated by at least one minute Keep a log of your BP readings For a visual, please reference this diagram: http://ccnc.care/bpdiagram  Provider Name: [facility]     Phone: [phone]  Zone  1: ALL CLEAR  Continue to monitor your symptoms:  BP reading is less than 140 (top number) or less than 90 (bottom number)  No right upper stomach pain No headaches or seeing spots No feeling nauseated or throwing up No swelling in face and hands  Zone 2: CAUTION Call your doctor's office for any of the following:  BP reading is greater than 140 (top number) or greater than 90 (bottom number)  Stomach pain under your ribs in the middle or right side Headaches or seeing spots Feeling nauseated or throwing up Swelling in face and hands  Zone 3: EMERGENCY  Seek immediate medical care if you have any of the following:  BP reading is greater than160 (top number) or greater than 110 (bottom number) Severe headaches not improving with Tylenol Serious difficulty catching your breath Any worsening symptoms from Zone 2     Second Trimester of Pregnancy The second trimester is from week 14 through week 27 (months 4 through 6). The second trimester is often a time when you feel your best. Your body has adjusted to being pregnant, and you begin to feel better physically. Usually, morning sickness has lessened or quit completely, you may have more energy, and you may have an increase in appetite. The second trimester is also a time when the fetus is growing rapidly. At the end of the sixth month, the fetus is about 9 inches long and weighs about 1 pounds. You will likely begin to feel the baby move (quickening) between 16 and 20 weeks of pregnancy. Body changes during your second trimester Your body continues to go through many changes during your second trimester. The changes vary from woman to woman. Your weight will continue to increase. You will notice your lower abdomen bulging out. You may begin to get stretch marks on your hips, abdomen, and breasts. You may develop headaches that can be relieved by medicines. The medicines should be approved by your health care provider. You may urinate  more often because the fetus is pressing on your bladder. You may develop or continue to have heartburn as a result of your pregnancy. You may develop constipation because certain hormones are causing the muscles that push waste through your intestines to slow down. You may develop hemorrhoids or swollen, bulging veins (varicose veins). You may have back pain. This is caused by: Weight gain. Pregnancy hormones that are relaxing the joints in your pelvis. A shift in weight and the muscles that support your balance. Your breasts will continue to grow and they will continue to become tender. Your gums may bleed and may be sensitive to brushing and flossing. Dark spots or blotches (chloasma, mask of pregnancy) may develop on your face. This will likely fade after the baby is born. A dark line from your belly button to the pubic area (linea nigra) may appear. This will likely fade after the baby is born. You may have changes in your hair. These can include thickening of your hair, rapid  growth, and changes in texture. Some women also have hair loss during or after pregnancy, or hair that feels dry or thin. Your hair will most likely return to normal after your baby is born.  What to expect at prenatal visits During a routine prenatal visit: You will be weighed to make sure you and the fetus are growing normally. Your blood pressure will be taken. Your abdomen will be measured to track your baby's growth. The fetal heartbeat will be listened to. Any test results from the previous visit will be discussed.  Your health care provider may ask you: How you are feeling. If you are feeling the baby move. If you have had any abnormal symptoms, such as leaking fluid, bleeding, severe headaches, or abdominal cramping. If you are using any tobacco products, including cigarettes, chewing tobacco, and electronic cigarettes. If you have any questions.  Other tests that may be performed during your second  trimester include: Blood tests that check for: Low iron levels (anemia). High blood sugar that affects pregnant women (gestational diabetes) between 32 and 28 weeks. Rh antibodies. This is to check for a protein on red blood cells (Rh factor). Urine tests to check for infections, diabetes, or protein in the urine. An ultrasound to confirm the proper growth and development of the baby. An amniocentesis to check for possible genetic problems. Fetal screens for spina bifida and Down syndrome. HIV (human immunodeficiency virus) testing. Routine prenatal testing includes screening for HIV, unless you choose not to have this test.  Follow these instructions at home: Medicines Follow your health care provider's instructions regarding medicine use. Specific medicines may be either safe or unsafe to take during pregnancy. Take a prenatal vitamin that contains at least 600 micrograms (mcg) of folic acid. If you develop constipation, try taking a stool softener if your health care provider approves. Eating and drinking Eat a balanced diet that includes fresh fruits and vegetables, whole grains, good sources of protein such as meat, eggs, or tofu, and low-fat dairy. Your health care provider will help you determine the amount of weight gain that is right for you. Avoid raw meat and uncooked cheese. These carry germs that can cause birth defects in the baby. If you have low calcium intake from food, talk to your health care provider about whether you should take a daily calcium supplement. Limit foods that are high in fat and processed sugars, such as fried and sweet foods. To prevent constipation: Drink enough fluid to keep your urine clear or pale yellow. Eat foods that are high in fiber, such as fresh fruits and vegetables, whole grains, and beans. Activity Exercise only as directed by your health care provider. Most women can continue their usual exercise routine during pregnancy. Try to exercise for  30 minutes at least 5 days a week. Stop exercising if you experience uterine contractions. Avoid heavy lifting, wear low heel shoes, and practice good posture. A sexual relationship may be continued unless your health care provider directs you otherwise. Relieving pain and discomfort Wear a good support bra to prevent discomfort from breast tenderness. Take warm sitz baths to soothe any pain or discomfort caused by hemorrhoids. Use hemorrhoid cream if your health care provider approves. Rest with your legs elevated if you have leg cramps or low back pain. If you develop varicose veins, wear support hose. Elevate your feet for 15 minutes, 3-4 times a day. Limit salt in your diet. Prenatal Care Write down your questions. Take them to your prenatal  visits. Keep all your prenatal visits as told by your health care provider. This is important. Safety Wear your seat belt at all times when driving. Make a list of emergency phone numbers, including numbers for family, friends, the hospital, and police and fire departments. General instructions Ask your health care provider for a referral to a local prenatal education class. Begin classes no later than the beginning of month 6 of your pregnancy. Ask for help if you have counseling or nutritional needs during pregnancy. Your health care provider can offer advice or refer you to specialists for help with various needs. Do not use hot tubs, steam rooms, or saunas. Do not douche or use tampons or scented sanitary pads. Do not cross your legs for long periods of time. Avoid cat litter boxes and soil used by cats. These carry germs that can cause birth defects in the baby and possibly loss of the fetus by miscarriage or stillbirth. Avoid all smoking, herbs, alcohol, and unprescribed drugs. Chemicals in these products can affect the formation and growth of the baby. Do not use any products that contain nicotine or tobacco, such as cigarettes and e-cigarettes.  If you need help quitting, ask your health care provider. Visit your dentist if you have not gone yet during your pregnancy. Use a soft toothbrush to brush your teeth and be gentle when you floss. Contact a health care provider if: You have dizziness. You have mild pelvic cramps, pelvic pressure, or nagging pain in the abdominal area. You have persistent nausea, vomiting, or diarrhea. You have a bad smelling vaginal discharge. You have pain when you urinate. Get help right away if: You have a fever. You are leaking fluid from your vagina. You have spotting or bleeding from your vagina. You have severe abdominal cramping or pain. You have rapid weight gain or weight loss. You have shortness of breath with chest pain. You notice sudden or extreme swelling of your face, hands, ankles, feet, or legs. You have not felt your baby move in over an hour. You have severe headaches that do not go away when you take medicine. You have vision changes. Summary The second trimester is from week 14 through week 27 (months 4 through 6). It is also a time when the fetus is growing rapidly. Your body goes through many changes during pregnancy. The changes vary from woman to woman. Avoid all smoking, herbs, alcohol, and unprescribed drugs. These chemicals affect the formation and growth your baby. Do not use any tobacco products, such as cigarettes, chewing tobacco, and e-cigarettes. If you need help quitting, ask your health care provider. Contact your health care provider if you have any questions. Keep all prenatal visits as told by your health care provider. This is important. This information is not intended to replace advice given to you by your health care provider. Make sure you discuss any questions you have with your health care provider. Document Released: 07/21/2001 Document Revised: 01/02/2016 Document Reviewed: 09/27/2012 Elsevier Interactive Patient Education  2017 [employer].

## 2021-12-30 NOTE — Therapy (Addendum)
OUTPATIENT PHYSICAL THERAPY TREATMENT NOTE   [name] MRN: [id number] DOB:April 06, 2001, 21 y.o., female Today's Date: 12/30/2021  PCP: Wannetta Sender, FNP REFERRING PROVIDER: Drue Second I*  END OF SESSION:   PT End of Session - 12/30/21 1433     Visit Number 3    Number of Visits 12    Date for PT Re-Evaluation 02/11/22    Authorization Type [place] Medicaid [employer]    PT Start Time 1430    PT Stop Time 1508    PT Time Calculation (min) 38 min    Activity Tolerance Patient tolerated treatment well    Behavior During Therapy [facility] for tasks assessed/performed           Start time:1528 Time [id number] Total time: 39 min Pt tolerated treatment well. 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   Depression, major, single episode, mild ([place]) 08/20/2019   Migraine without aura and without status migrainosus, not intractable 04/01/2018   Episodic tension-type headache, not intractable 04/01/2018   Syncopal seizure ([place]) 04/01/2018   Closed head injury with brief loss of consciousness ([place]) 04/01/2018   Adolescent idiopathic scoliosis of thoracolumbar region 04/01/2018   Nonintractable episodic headache 03/17/2018   Syncope 03/17/2018   Depression 03/02/2018   Curvature of spine 03/02/2018   Moderate single current episode of major depressive disorder ([place]) 07/08/2016   ACHILLES TENDINITIS 11/10/2009    REFERRING DIAG: REFERRING DIAG:  Z3A.12 (ICD-10-CM) - [redacted] weeks gestation of pregnancy  F41.9,F32.A (ICD-10-CM) - Anxiety and depression  M41.125 (ICD-10-CM) - Adolescent idiopathic scoliosis of  thoracolumbar region    THERAPY DIAG:  Pain, lumbar region  PERTINENT HISTORY: Hyperthyroidism, depression,  PRECAUTIONS: pregnancy, no estim  SUBJECTIVE: 21 weeks today. I have not been stretching much because of trying to find a job.  PAIN:  Are you having pain? No    OBJECTIVE: ALL TESTING COMPLETED AT INITIAL EVALUATION UNLESS OTHERWISE DATED.    DIAGNOSTIC FINDINGS:    Mild convex rightward scoliosis of the lower thoracic spine has its apex at the T8 vertebral body. Measuring from the T5 inferior endplate to the 624THL pedicles, curvature measures 15.1 degrees convex right.   Compensatory convex leftward scoliosis of the lumbar spine noted with apex at the L1 vertebral body. Measuring from the T12 pedicles to the L3 pedicles, curvature is 22.7 degrees convex leftward.   IMPRESSION: Thoracolumbar scoliosis as above.   PATIENT SURVEYS:  Oswestry Score: 15 / 50 or 30 %   SCREENING FOR RED FLAGS: Bowel or bladder incontinence: No Spinal tumors: No Cauda equina syndrome: No   COGNITION:           Overall cognitive status: Difficulty to assess                              SENSATION: WFL   MUSCLE LENGTH: Hamstrings: >20 deg limitation in supine 90/90   POSTURE:  Prefers to sit in L SB against chair arm, R scoliotic curve present with standing  observation   PALPATION: Signficant TTP of L T/S and lumbar paraspinals TTP into cervical paraspinals    LUMBAR ROM:    Active  A/PROM  11/13/2021  Flexion 50% with R deviation  Extension WFL with L devation; R SIJ pain  Right lateral flexion WFL  Left lateral flexion 75%  Right rotation WFL p! Into shoulder blade  Left rotation WFL p! Into shoulder blade   (Blank rows = not tested)   LE ROM: WFL limits throughtout but with pain into hip flexion, ER, and extension at the L SIJ and    LE MMT:   MMT Right 11/13/2021 Left 11/13/2021  Hip flexion 4/5 p! 4/5 p!  Hip extension      Hip abduction 4/5 4/5  Hip  adduction 4/5 4/5  Hip internal rotation      Hip external rotation 4/5 4/5   (Blank rows = not tested)   LUMBAR SPECIAL TESTS:  Slump test: Negative, SI Compression/distraction test: Positive, FABER test: Positive, and Trendelenburg sign: Positive     GAIT: Distance walked: 69ft Assistive device utilized: None Level of assistance: Complete Independence Comments: slight R SB in walking, lateral sway       TODAY'S TREATMENT  5/23:[place] portion MANUAL: STM to bil lumbar paraspinals, Lt QL in Rt SL Seated HSS Standing gastroc stretch Sidelying hip abd  & circles Standing squat taps to table Seated piriformis stretch Knees/elbows plank  Aquatics: Walking forward, back and side stepping multiple widths Stretching: lumbar spine/core rotators in seated position on bench -pelvis stretching seated on noodle: hip hiking R/L; ant/posterior tilts; pelvic rotation CW and CCW Core strengthening: kick board push/pull; blue noodle pull down 2x10 Straddling noodle: cycling x 6 widths; skiing 2 sets of 20      4/20: Qped transv abd engagement, added alt shoulder flexion, alt hip ext Bird dog  Rows & straight arm pull downs in mini squat Ball on wall squats Sumo squat with hold PRI Left sidelying, Rt glut max  EVAL Exercises - Clamshell  - 1 x daily - 7 x weekly - 2 sets - 10 reps - Supine Bridge with Resistance Band  - 1 x daily - 7 x weekly - 2 sets - 10 reps - Cat Cow  - 1 x daily - 7 x weekly - 2 sets - 10 reps - Seated Piriformis Stretch  - 2 x daily - 7 x weekly - 1 sets - 3 reps - 30 hold     PATIENT EDUCATION:  Education details: exercise form/rationale   Person educated: Patient Education method: Explanation, Demonstration, Tactile cues, Verbal cues, and Handouts Education comprehension: verbalized understanding, returned demonstration, verbal cues required, and tactile cues required         HOME EXERCISE PROGRAM: Access Code: [email or web address] URL:  [email or web address]    ASSESSMENT:   CLINICAL IMPRESSION: Land portion: pt did very well on land today. Is nervous about shaking the baby too much and we discussed proper exercise and progression of what she was already doing.  Aquatic portion: Pt also did very well in pool.  Able to get good lumbar and pelvic movement/stretching. She does report slight "feeling" in LB which subsided after walking a few widths. She is assured that the pool tmep is not dangerous for baby.  Good candidate for aquatics for core strengthening.     OBJECTIVE IMPAIRMENTS Abnormal gait, decreased activity tolerance, decreased endurance, decreased mobility, difficulty walking, decreased ROM, decreased strength, hypomobility, increased muscle spasms, impaired flexibility, improper body mechanics,  postural dysfunction, and pain.    ACTIVITY LIMITATIONS cleaning, community activity, driving, occupation, medication management, yard work, shopping, and exercise .    PERSONAL FACTORS Behavior pattern, Fitness, Time since onset of injury/illness/exacerbation, and 1-2 comorbidities:    are also affecting patient's functional outcome.      REHAB POTENTIAL: Good   CLINICAL DECISION MAKING: Evolving/moderate complexity   EVALUATION COMPLEXITY: Moderate     GOALS:     SHORT TERM GOALS: Target date: 12/25/2021   Pt will become independent with HEP in order to demonstrate synthesis of PT education.    Goal status: INITIAL   2.  Pt will report at least 2 pt reduction on NPRS scale for pain in order to demonstrate functional improvement with household activity, self care, and ADL.    Goal status: INITIAL   3.  Pt will be able to demonstrate ability to perform supine to sit to stand transfers without pain in order to demonstrate functional improvement in lumbopelvic function for self-care and house hold duties.   Goal status: INITIAL     LONG TERM GOALS: Target date: 02/05/2022   Pt  will become  independent with final HEP in order to demonstrate synthesis of PT education.    Goal status: INITIAL   2.  Pt will be able to demonstrate/report ability to walk >15s mins without pain in order to demonstrate functional improvement and tolerance to exercise and community mobility.     Goal status: INITIAL   3.  Pt will demonstrate at least a 12.8 improvement in Oswestry Index in order to demonstrate a clinically significant change in LBP and function.     Goal status: INITIAL   4.  Pt will be able to demonstrate/report ability to sit/stand/sleep for extended periods of time without pain in order to demonstrate functional improvement and tolerance to static positioning.    Goal status: INITIAL   5.  Pt will be able to lift/squat/hold >15 lbs in order to demonstrate functional improvement in lumbopelvic strength for return to PLOF and prevention of further functional decline.    Goal status: INITIAL     PLAN: PT FREQUENCY: 1-2x/week   PT DURATION: 12 weeks (likely D/C by 8 wks)   PLANNED INTERVENTIONS: Therapeutic exercises, Therapeutic activity, Neuromuscular re-education, Balance training, Gait training, Patient/Family education, Joint mobilization, Aquatic Therapy, Dry Needling, Electrical stimulation, Spinal mobilization, Cryotherapy, Moist heat, Taping, Vasopneumatic device, and Manual therapy.   PLAN FOR NEXT SESSION:cont PRI Aquatics: core strengthening/ stretching     Esai Stecklein C. Hulan Szumski PT, DPT 12/30/21 4:02 PM  Addended [facility] Tharon Aquas) [place] MPT 12/30/21 6:29 PM

## 2021-12-31 LAB — CERVICOVAGINAL ANCILLARY ONLY
Bacterial Vaginitis (gardnerella): NEGATIVE
Candida Glabrata: NEGATIVE
Candida Vaginitis: POSITIVE — AB
Chlamydia: NEGATIVE
Comment: NEGATIVE
Comment: NEGATIVE
Comment: NEGATIVE
Comment: NEGATIVE
Comment: NEGATIVE
Comment: NORMAL
Neisseria Gonorrhea: NEGATIVE
Trichomonas: NEGATIVE

## 2022-01-01 LAB — URINE CULTURE: Organism ID, Bacteria: NO GROWTH

## 2022-01-02 ENCOUNTER — Other Ambulatory Visit: Payer: Self-pay | Admitting: Advanced Practice Midwife

## 2022-01-02 ENCOUNTER — Encounter: Payer: Self-pay | Admitting: Women's Health

## 2022-01-02 MED ORDER — TERCONAZOLE 0.4 % VA CREA: 1.0000 | TOPICAL_CREAM | Freq: Every day | VAGINAL | 0 refills | Status: DC

## 2022-01-07 ENCOUNTER — Ambulatory Visit (HOSPITAL_BASED_OUTPATIENT_CLINIC_OR_DEPARTMENT_OTHER): Payer: Medicaid Other | Admitting: Physical Therapy

## 2022-01-07 ENCOUNTER — Encounter: Payer: Self-pay | Admitting: Advanced Practice Midwife

## 2022-01-08 ENCOUNTER — Telehealth: Payer: Self-pay | Admitting: Clinical

## 2022-01-08 ENCOUNTER — Ambulatory Visit (HOSPITAL_BASED_OUTPATIENT_CLINIC_OR_DEPARTMENT_OTHER): Payer: Medicaid Other | Admitting: Physical Therapy

## 2022-01-08 ENCOUNTER — Other Ambulatory Visit: Payer: Medicaid Other

## 2022-01-08 ENCOUNTER — Encounter: Payer: Medicaid Other | Admitting: Advanced Practice Midwife

## 2022-01-08 NOTE — Telephone Encounter (Signed)
Attempt to call regarding referral; Left HIPPA-compliant message to call back Barbette Mcglaun from [facility] at  [phone] (Terri Malerba's office).   

## 2022-01-12 ENCOUNTER — Encounter: Payer: Self-pay | Admitting: Women's Health

## 2022-01-12 ENCOUNTER — Ambulatory Visit (INDEPENDENT_AMBULATORY_CARE_PROVIDER_SITE_OTHER): Payer: Medicaid Other

## 2022-01-12 ENCOUNTER — Ambulatory Visit (INDEPENDENT_AMBULATORY_CARE_PROVIDER_SITE_OTHER): Payer: Medicaid Other | Admitting: Women's Health

## 2022-01-12 VITALS — BP 107/75 | HR 80 | Wt 121.0 lb

## 2022-01-12 DIAGNOSIS — Z363 Encounter for antenatal screening for malformations: Secondary | ICD-10-CM

## 2022-01-12 DIAGNOSIS — Z3402 Encounter for supervision of normal first pregnancy, second trimester: Secondary | ICD-10-CM

## 2022-01-12 NOTE — Patient Instructions (Addendum)
[place], thank you for choosing our office today! We appreciate the opportunity to meet your healthcare needs. You may receive a short survey by mail, e-mail, or through [employer]. If you are happy with your care we would appreciate if you could take just a few minutes to complete the survey questions. We read all of your comments and take your feedback very seriously. Thank you again for choosing our office.  Center for [employer] Team at [facility]  [facility] at [facility] ([address] [place], [place] [address]) Entrance C, located off of [address]  call back Prentiss from [employer] for [employer] at [employer] for [facility] at  [phone] [facility] office).   CLASSES: Go to [email or web address] to register for classes (childbirth, breastfeeding, waterbirth, infant CPR, daddy bootcamp, etc.)  Call the office [phone]) or go to [facility] if: You begin to have strong, frequent contractions Your water breaks.  Sometimes it is a big gush of fluid, sometimes it is just a trickle that keeps getting your panties wet or running down your legs You have vaginal bleeding.  It is normal to have a small amount of spotting if your cervix was checked.  You don't feel your baby moving like normal.  If you don't, get you something to eat and drink and lay down and focus on feeling your baby move.   If your baby is still not moving like normal, you should call the office or go to [facility].  Call the office [phone]) or go to [facility] hospital for these signs of pre-eclampsia: Severe headache that does not go away with Tylenol Visual changes- seeing spots, double, blurred vision Pain under your right breast or upper abdomen that does not go away with Tums or heartburn medicine Nausea and/or vomiting Severe swelling in your hands, feet, and face    [facility] Pediatricians/Family Doctors [facility]): [address] Colette Ribas,  [phone]           [facility]: [address], [phone]                [facility]): [address], [phone] (call to ask if accepting patients) [facility] Department: [address] [place], [phone]    [facility] Pediatricians/Family Doctors [facility]): [address], [phone] [facility]: [address] [place], [phone] [facility]: [address], [phone]  [facility] Doctors  [facility]): [phone] [facility]: [address], [phone]   [facility] Doctors [facility]: [address] [phone]  [facility] Doctors  [facility]: [phone], [phone]  Home Blood Pressure Monitoring for Patients   Your provider has recommended that you check your blood pressure (BP) at least once a week at home. If you do not have a blood pressure cuff at home, one will be provided for you. Contact your provider if you have not received your monitor within 1 week.   Helpful Tips for Accurate Home Blood Pressure Checks  Don't smoke, exercise, or drink caffeine 30 minutes before checking your BP Use the restroom before checking your BP (a full bladder can raise your pressure) Relax in a comfortable upright chair Feet on the ground Left arm resting comfortably on a flat surface at the level of your heart Legs uncrossed Back supported Sit quietly  and don't talk Place the cuff on your bare arm Adjust snuggly, so that only two fingertips can fit between your skin and the top of the cuff Check 2 readings separated by at least one minute Keep a log of your BP readings For a visual, please reference this diagram: http://ccnc.care/bpdiagram  Provider Name: [facility]     Phone: [phone]  Zone 1: ALL CLEAR  Continue to  monitor your symptoms:  BP reading is less than 140 (top number) or less than 90 (bottom number)  No right upper stomach pain No headaches or seeing spots No feeling nauseated or throwing up No swelling in face and hands  Zone 2: CAUTION Call your doctor's office for any of the following:  BP reading is greater than 140 (top number) or greater than 90 (bottom number)  Stomach pain under your ribs in the middle or right side Headaches or seeing spots Feeling nauseated or throwing up Swelling in face and hands  Zone 3: EMERGENCY  Seek immediate medical care if you have any of the following:  BP reading is greater than160 (top number) or greater than 110 (bottom number) Severe headaches not improving with Tylenol Serious difficulty catching your breath Any worsening symptoms from Zone 2   Second Trimester of Pregnancy The second trimester is from week 13 through week 28, months 4 through 6. The second trimester is often a time when you feel your best. Your body has also adjusted to being pregnant, and you begin to feel better physically. Usually, morning sickness has lessened or quit completely, you may have more energy, and you may have an increase in appetite. The second trimester is also a time when the fetus is growing rapidly. At the end of the sixth month, the fetus is about 9 inches long and weighs about 1 pounds. You will likely begin to feel the baby move (quickening) between 18 and 20 weeks of the pregnancy. BODY CHANGES Your body goes through many changes during pregnancy. The changes vary from woman to woman.  Your weight will continue to increase. You will notice your lower abdomen bulging out. You may begin to get stretch marks on your hips, abdomen, and breasts. You may develop headaches that can be relieved by medicines approved by your health care provider. You may urinate more often because the fetus is pressing on your bladder. You may develop or continue to have  heartburn as a result of your pregnancy. You may develop constipation because certain hormones are causing the muscles that push waste through your intestines to slow down. You may develop hemorrhoids or swollen, bulging veins (varicose veins). You may have back pain because of the weight gain and pregnancy hormones relaxing your joints between the bones in your pelvis and as a result of a shift in weight and the muscles that support your balance. Your breasts will continue to grow and be tender. Your gums may bleed and may be sensitive to brushing and flossing. Dark spots or blotches (chloasma, mask of pregnancy) may develop on your face. This will likely fade after the baby is born. A dark line from your belly button to the pubic area (linea nigra) may appear. This will likely fade after the baby is born. You may have changes in your hair. These can include thickening of your hair, rapid growth, and changes in texture. Some women also have hair loss during or after pregnancy, or hair that feels dry or thin. Your hair will most likely return  to normal after your baby is born. WHAT TO EXPECT AT YOUR PRENATAL VISITS During a routine prenatal visit: You will be weighed to make sure you and the fetus are growing normally. Your blood pressure will be taken. Your abdomen will be measured to track your baby's growth. The fetal heartbeat will be listened to. Any test results from the previous visit will be discussed. Your health care provider may ask you: How you are feeling. If you are feeling the baby move. If you have had any abnormal symptoms, such as leaking fluid, bleeding, severe headaches, or abdominal cramping. If you have any questions. Other tests that may be performed during your second trimester include: Blood tests that check for: Low iron levels (anemia). Gestational diabetes (between 24 and 28 weeks). Rh antibodies. Urine tests to check for infections, diabetes, or protein in the  urine. An ultrasound to confirm the proper growth and development of the baby. An amniocentesis to check for possible genetic problems. Fetal screens for spina bifida and Down syndrome. HOME CARE INSTRUCTIONS  Avoid all smoking, herbs, alcohol, and unprescribed drugs. These chemicals affect the formation and growth of the baby. Follow your health care provider's instructions regarding medicine use. There are medicines that are either safe or unsafe to take during pregnancy. Exercise only as directed by your health care provider. Experiencing uterine cramps is a good sign to stop exercising. Continue to eat regular, healthy meals. Wear a good support bra for breast tenderness. Do not use hot tubs, steam rooms, or saunas. Wear your seat belt at all times when driving. Avoid raw meat, uncooked cheese, cat litter boxes, and soil used by cats. These carry germs that can cause birth defects in the baby. Take your prenatal vitamins. Try taking a stool softener (if your health care provider approves) if you develop constipation. Eat more high-fiber foods, such as fresh vegetables or fruit and whole grains. Drink plenty of fluids to keep your urine clear or pale yellow. Take warm sitz baths to soothe any pain or discomfort caused by hemorrhoids. Use hemorrhoid cream if your health care provider approves. If you develop varicose veins, wear support hose. Elevate your feet for 15 minutes, 3-4 times a day. Limit salt in your diet. Avoid heavy lifting, wear low heel shoes, and practice good posture. Rest with your legs elevated if you have leg cramps or low back pain. Visit your dentist if you have not gone yet during your pregnancy. Use a soft toothbrush to brush your teeth and be gentle when you floss. A sexual relationship may be continued unless your health care provider directs you otherwise. Continue to go to all your prenatal visits as directed by your health care provider. SEEK MEDICAL CARE IF:   You have dizziness. You have mild pelvic cramps, pelvic pressure, or nagging pain in the abdominal area. You have persistent nausea, vomiting, or diarrhea. You have a bad smelling vaginal discharge. You have pain with urination. SEEK IMMEDIATE MEDICAL CARE IF:  You have a fever. You are leaking fluid from your vagina. You have spotting or bleeding from your vagina. You have severe abdominal cramping or pain. You have rapid weight gain or loss. You have shortness of breath with chest pain. You notice sudden or extreme swelling of your face, hands, ankles, feet, or legs. You have not felt your baby move in over an hour. You have severe headaches that do not go away with medicine. You have vision changes. Document Released: 07/21/2001 Document Revised: 08/01/2013 Document  Reviewed: 09/27/2012 ExitCare Patient Information 2015 [employer] [place]. This information is not intended to replace advice given to you by your health care provider. Make sure you discuss any questions you have with your health care provider.

## 2022-01-12 NOTE — Progress Notes (Signed)
[place] 22+6 wks,breech,posterior placenta gr 0,normal ovaries,CX 2.8 cm,FHR 138 bpm,SVP 5.1 cm,EFW 507 g 26%,BPD 4.7%,HC 4.6%,anatomy complete

## 2022-01-12 NOTE — Progress Notes (Signed)
LOW-RISK PREGNANCY VISIT [name] MRN [id number]  Date of birth: 2000-08-13 Chief Complaint:   Routine Prenatal Visit  History of Present Illness:   Yolanda Myers is a 21 y.o. G76P0000 female at [redacted]w[redacted]d with an Estimated Date of Delivery: 05/12/22 being seen today for ongoing management of a low-risk pregnancy.   Today she reports  pain Rt lower back the other day . Contractions: Not present. Vag. Bleeding: None.  Movement: Present. denies leaking of fluid.     10/30/2021    3:35 PM 08/15/2019    2:20 PM 03/02/2018    9:48 AM 11/17/2016   12:53 PM 10/22/2016    2:34 PM  Depression screen PHQ 2/9  Decreased Interest 1 3 1 1 2   Down, Depressed, Hopeless 1 1 2 2 1   PHQ - 2 Score 2 4 3 3 3   Altered sleeping 2 0 1 1 2   Tired, decreased energy 1 1 2 3 1   Change in appetite 0 1 0 0 0  Feeling bad or failure about yourself  1 1 0 2 1  Trouble concentrating 0 0 0 1 2  Moving slowly or fidgety/restless 0 0 0 0 0  Suicidal thoughts 0 1 0 1 2  PHQ-9 Score 6 8 6 11 11   Difficult doing work/chores  Somewhat difficult Somewhat difficult          10/30/2021    3:36 PM 08/15/2019    2:21 PM  GAD 7 : Generalized Anxiety Score  Nervous, Anxious, on Edge 0 2  Control/stop worrying 0 1  Worry too much - different things 0 0  Trouble relaxing 0 0  Restless 0 1  Easily annoyed or irritable 2 1  Afraid - awful might happen 0 0  Total GAD 7 Score 2 5  Anxiety Difficulty  Somewhat difficult      Review of Systems:   Pertinent items are noted in HPI Denies abnormal vaginal discharge w/ itching/odor/irritation, headaches, visual changes, shortness of breath, chest pain, abdominal pain, severe nausea/vomiting, or problems with urination or bowel movements unless otherwise stated above. Pertinent History Reviewed:  Reviewed past medical,surgical, social, obstetrical and family history.  Reviewed problem list, medications and allergies. Physical Assessment:   Vitals:    01/12/22 1134  BP: 107/75  Pulse: 80  Weight: 121 lb (54.9 kg)  Body mass index is 20.14 kg/m.        Physical Examination:   General appearance: Well appearing, and in no distress  Mental status: Alert, oriented to person, place, and time  Skin: Warm & dry  Cardiovascular: Normal heart rate noted  Respiratory: Normal respiratory effort, no distress  Abdomen: Soft, gravid, nontender  Pelvic: Cervical exam deferred         Extremities: Edema: None  Fetal Status:     Movement: Present  22+6 wks,breech,posterior placenta gr 0,normal ovaries,CX 2.8 cm,FHR 138 bpm,SVP 5.1 cm,EFW 507 g 26%,BPD 4.7%,HC 4.6%,anatomy complete   Chaperone: N/A   No results found for this or any previous visit (from the past 24 hour(s)).  Assessment & Plan:  1) Low-risk pregnancy G1P0000 at [redacted]w[redacted]d with an Estimated Date of Delivery: 05/12/22   2) Today's u/s BPD 4.7%/HC 4.6%, discussed w/ LHE, will repeat at 28wks   Meds: No orders of the defined types were placed in this encounter.  Labs/procedures today: U/S  Plan:  Continue routine obstetrical care  Next visit: prefers in person    Reviewed: Preterm labor symptoms  and general obstetric precautions including but not limited to vaginal bleeding, contractions, leaking of fluid and fetal movement were reviewed in detail with the patient.  All questions were answered. Does have home bp cuff. Office bp cuff given: not applicable. Check bp weekly, let us know if consistently >140 and/or >90.  Follow-up: Return in about 3 weeks (around 02/02/2022) for LROB, CNM, in person; then 6wks from now for pn2, efw u/s and lrob w/ md or cnm.  Future Appointments  Date Time Provider Department Center  01/15/2022  3:15 PM Army Fossa, PT [facility]  02/03/2022  9:10 AM Cheral Marker, CNM [facility]    No orders of the defined types were placed in this encounter.  Cheral Marker CNM, [facility] 01/12/2022 12:10 PM

## 2022-01-14 NOTE — BH Specialist Note (Deleted)
Integrated Behavioral Health via Telemedicine Visit  01/14/2022 [name] [id number]  Number of Integrated Behavioral Health Clinician visits: No data recorded Session Start time: No data recorded  Session End time: No data recorded Total time in minutes: No data recorded  Referring Provider: *** Patient/Family location: *** [facility] Provider location: *** All persons participating in visit: *** Types of Service: {CHL AMB TYPE OF SERVICE:[phone]}  I connected with Yolanda Myers and/or Yolanda Myers's {family members:20773} via  Telephone or Engineer, civil (consulting)  (Video is Surveyor, mining) and verified that I am speaking with the correct person using two identifiers. Discussed confidentiality: {YES/NO:21197}  I discussed the limitations of telemedicine and the availability of in person appointments.  Discussed there is a possibility of technology failure and discussed alternative modes of communication if that failure occurs.  I discussed that engaging in this telemedicine visit, they consent to the provision of behavioral healthcare and the services will be billed under their insurance.  Patient and/or legal guardian expressed understanding and consented to Telemedicine visit: {YES/NO:21197}  Presenting Concerns: Patient and/or family reports the following symptoms/concerns: *** Duration of problem: ***; Severity of problem: {Mild/Moderate/Severe:20260}  Patient and/or Family's Strengths/Protective Factors: {CHL AMB BH PROTECTIVE FACTORS:[phone]}  Goals Addressed: Patient will:  Reduce symptoms of: {IBH Symptoms:21014056}   Increase knowledge and/or ability of: {IBH Patient Tools:21014057}   Demonstrate ability to: {IBH Goals:21014053}  Progress towards Goals: {CHL AMB BH PROGRESS TOWARDS [phone]}  Interventions: Interventions utilized:  {IBH Interventions:21014054} Standardized Assessments completed: {IBH Screening  Tools:21014051}  Patient and/or Family Response: ***  Assessment: Patient currently experiencing ***.   Patient may benefit from ***.  Plan: Follow up with behavioral health clinician on : *** Behavioral recommendations: *** Referral(s): {IBH Referrals:21014055}  I discussed the assessment and treatment plan with the patient and/or parent/guardian. They were provided an opportunity to ask questions and all were answered. They agreed with the plan and demonstrated an understanding of the instructions.   They were advised to call back or seek an in-person evaluation if the symptoms worsen or if the condition fails to improve as anticipated.  Yolanda Close [place], LCSW

## 2022-01-15 ENCOUNTER — Ambulatory Visit (HOSPITAL_BASED_OUTPATIENT_CLINIC_OR_DEPARTMENT_OTHER): Payer: Medicaid Other | Admitting: Physical Therapy

## 2022-01-15 ENCOUNTER — Encounter (HOSPITAL_BASED_OUTPATIENT_CLINIC_OR_DEPARTMENT_OTHER): Payer: Self-pay | Admitting: Physical Therapy

## 2022-01-22 NOTE — BH Specialist Note (Signed)
Pt did not arrive to video visit and did not answer the phone; Unable to leave voicemail as mailbox is full; left MyChart message for patient.   

## 2022-01-29 ENCOUNTER — Inpatient Hospital Stay (HOSPITAL_COMMUNITY): Payer: Medicaid Other

## 2022-01-29 ENCOUNTER — Telehealth: Payer: Self-pay | Admitting: Obstetrics & Gynecology

## 2022-01-29 ENCOUNTER — Other Ambulatory Visit: Payer: Self-pay

## 2022-01-29 ENCOUNTER — Encounter: Payer: Self-pay | Admitting: Women's Health

## 2022-01-29 ENCOUNTER — Encounter: Payer: Self-pay | Admitting: Obstetrics & Gynecology

## 2022-01-29 ENCOUNTER — Encounter (HOSPITAL_COMMUNITY): Payer: Self-pay | Admitting: Family Medicine

## 2022-01-29 ENCOUNTER — Ambulatory Visit (INDEPENDENT_AMBULATORY_CARE_PROVIDER_SITE_OTHER): Payer: Medicaid Other | Admitting: Obstetrics & Gynecology

## 2022-01-29 ENCOUNTER — Inpatient Hospital Stay (HOSPITAL_COMMUNITY)
Admission: AD | Admit: 2022-01-29 | Discharge: 2022-01-30 | Disposition: A | Discharge status: Home / Self Care | Payer: Medicaid Other | Attending: Family Medicine | Admitting: Family Medicine

## 2022-01-29 VITALS — BP 105/68 | HR 84 | Wt 125.0 lb

## 2022-01-29 DIAGNOSIS — Z3A25 25 weeks gestation of pregnancy: Secondary | ICD-10-CM | POA: Diagnosis not present

## 2022-01-29 DIAGNOSIS — O212 Late vomiting of pregnancy: Secondary | ICD-10-CM | POA: Insufficient documentation

## 2022-01-29 DIAGNOSIS — N1330 Unspecified hydronephrosis: Secondary | ICD-10-CM | POA: Insufficient documentation

## 2022-01-29 DIAGNOSIS — R609 Edema, unspecified: Secondary | ICD-10-CM | POA: Diagnosis not present

## 2022-01-29 DIAGNOSIS — R109 Unspecified abdominal pain: Secondary | ICD-10-CM | POA: Diagnosis not present

## 2022-01-29 DIAGNOSIS — Z3402 Encounter for supervision of normal first pregnancy, second trimester: Secondary | ICD-10-CM

## 2022-01-29 DIAGNOSIS — N201 Calculus of ureter: Secondary | ICD-10-CM

## 2022-01-29 DIAGNOSIS — O26892 Other specified pregnancy related conditions, second trimester: Secondary | ICD-10-CM | POA: Insufficient documentation

## 2022-01-29 LAB — COMPREHENSIVE METABOLIC PANEL
ALT: 16 U/L (ref 0–44)
AST: 21 U/L (ref 15–41)
Albumin: 3.2 g/dL — ABNORMAL LOW (ref 3.5–5.0)
Alkaline Phosphatase: 67 U/L (ref 38–126)
Anion gap: 9 (ref 5–15)
BUN: <5 mg/dL — ABNORMAL LOW (ref 6–20)
CO2: 22 mmol/L (ref 22–32)
Calcium: 8.9 mg/dL (ref 8.9–10.3)
Chloride: 107 mmol/L (ref 98–111)
Creatinine, Ser: 0.58 mg/dL (ref 0.44–1.00)
GFR, Estimated: >60 mL/min (ref >60)
Glucose, Bld: 87 mg/dL (ref 70–99)
Potassium: 3.6 mmol/L (ref 3.5–5.1)
Sodium: 138 mmol/L (ref 135–145)
Total Bilirubin: 0.8 mg/dL (ref 0.3–1.2)
Total Protein: 6.1 g/dL — ABNORMAL LOW (ref 6.5–8.1)

## 2022-01-29 LAB — CBC
HCT: 32.2 % — ABNORMAL LOW (ref 36.0–46.0)
Hemoglobin: 11.4 g/dL — ABNORMAL LOW (ref 12.0–15.0)
MCH: 33.4 pg (ref 26.0–34.0)
MCHC: 35.4 g/dL (ref 30.0–36.0)
MCV: 94.4 fL (ref 80.0–100.0)
Platelets: 160 10*3/uL (ref 150–400)
RBC: 3.41 MIL/uL — ABNORMAL LOW (ref 3.87–5.11)
RDW: 13.3 % (ref 11.5–15.5)
WBC: 12.1 10*3/uL — ABNORMAL HIGH (ref 4.0–10.5)
nRBC: 0 % (ref 0.0–0.2)

## 2022-01-29 LAB — URINALYSIS, ROUTINE W REFLEX MICROSCOPIC
Bilirubin Urine: NEGATIVE
Glucose, UA: NEGATIVE mg/dL
Hgb urine dipstick: NEGATIVE
Ketones, ur: 20 mg/dL — AB
Leukocytes,Ua: NEGATIVE
Nitrite: NEGATIVE
Protein, ur: NEGATIVE mg/dL
Specific Gravity, Urine: 1.005 (ref 1.005–1.030)
pH: 7 (ref 5.0–8.0)

## 2022-01-29 IMAGING — MR MR ABDOMEN W/O CM
18 of 20 series · 43 of 48 positions shown · non-contrast
Comparison: Renal ultrasound earlier same day

CLINICAL DATA: 21-year-old female at 25 weeks gestation with right
flank and lower quadrant pain.

EXAM:
MRI ABDOMEN AND PELVIS WITHOUT CONTRAST
TECHNIQUE: Multiplanar multisequence MR imaging of the abdomen and pelvis was
performed. No intravenous contrast was administered.

[Series 3: cor haste · coronal · 5.0mm · 1.04mm/px · 2 of 34 slices shown]
[im 1/34]
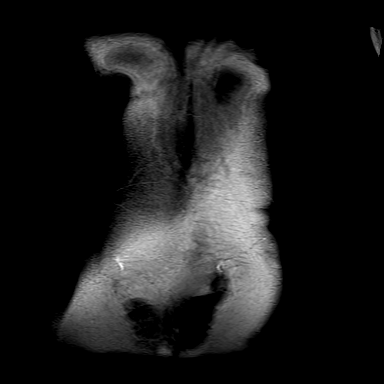
[im 34/34]
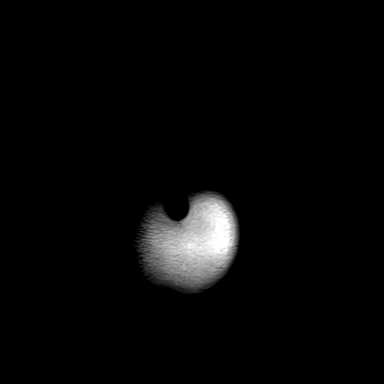

[Series 6: cor haste fs · coronal · 5.0mm · 1.04mm/px · 1 of 15 slices shown]
[im 1/15]
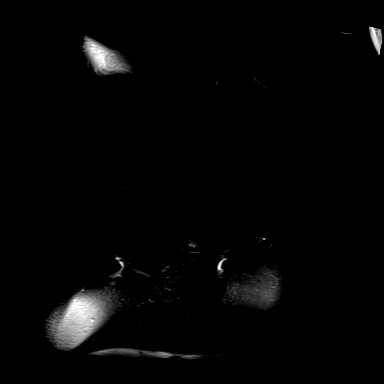

[Series 10: bSSFP · coronal · 5.0mm · 1.79mm/px · 2 of 34 slices shown (1 of 3)]
[im 1/34]
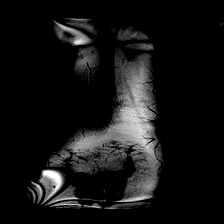
[im 34/34]
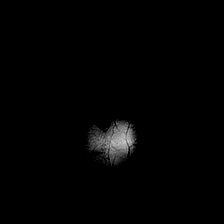

[Series 15: ax haste_comp · axial · 5.0mm · 0.99mm/px · z∈[-4,+188]mm · 2 of 33 slices shown (1 of 2)]
[im 1/33]
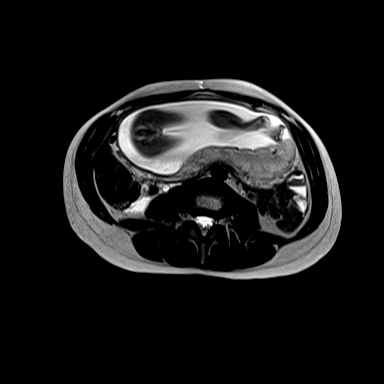
[im 33/33]
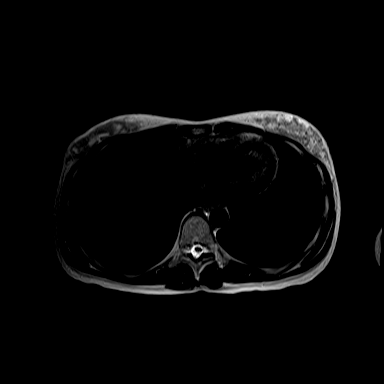

[Series 15: ax haste_comp · axial · 5.0mm · 1.19mm/px · z∈[-202,-10]mm · 2 of 33 slices shown (2 of 2)]
[im 1/33]
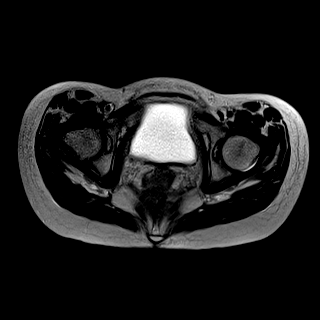
[im 33/33]
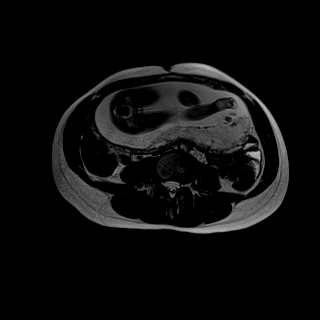

[Series 20: ax haste fs_comp · axial · 5.0mm · 0.99mm/px · z∈[-4,+188]mm · 2 of 33 slices shown (1 of 2)]
[im 1/33]
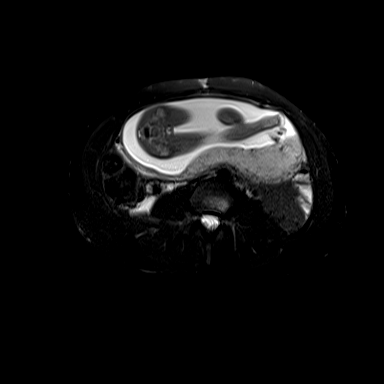
[im 33/33]
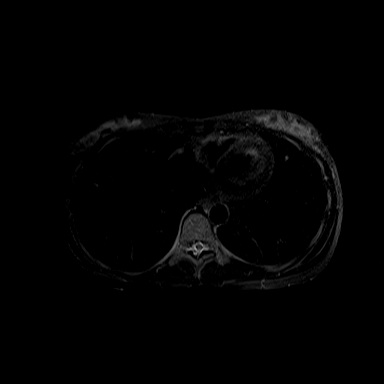

[Series 20: ax haste fs_comp · axial · 5.0mm · 1.19mm/px · z∈[-202,-10]mm · 2 of 33 slices shown (2 of 2)]
[im 1/33]
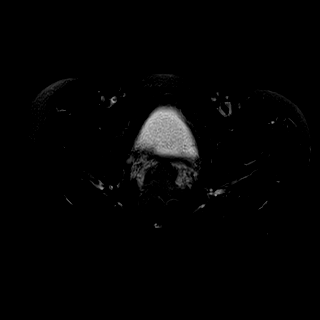
[im 33/33]
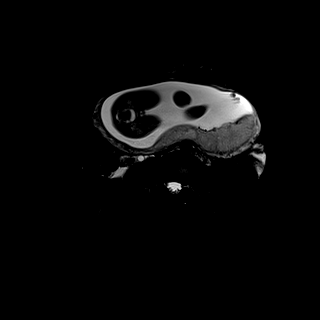

[Series 23: T2 fat-sat · axial · 5.0mm · 1.19mm/px · z∈[-22,+188]mm · 2 of 36 slices shown]
[im 1/36]
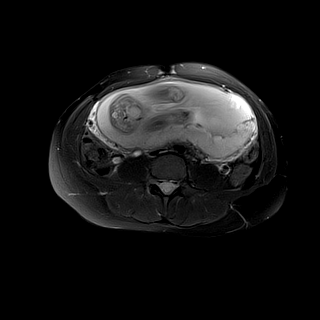
[im 36/36]
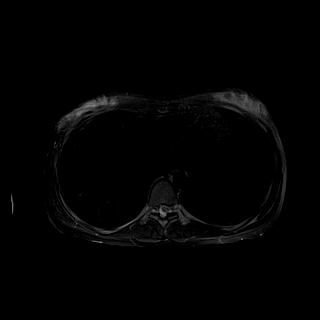

[Series 29: bSSFP · axial · 5.0mm · 0.74mm/px · z∈[-202,-10]mm · 2 of 33 slices shown (2 of 3)]
[im 1/33]
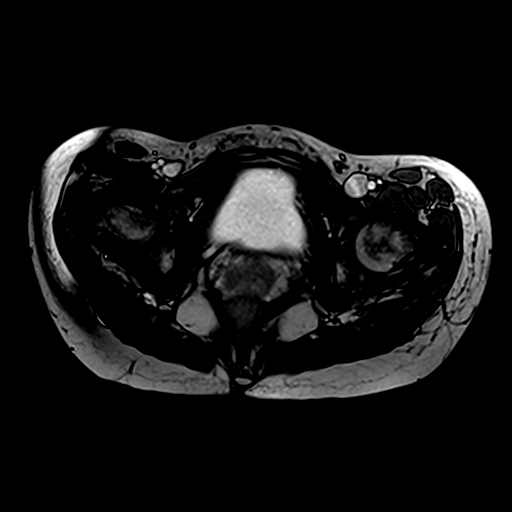
[im 33/33]
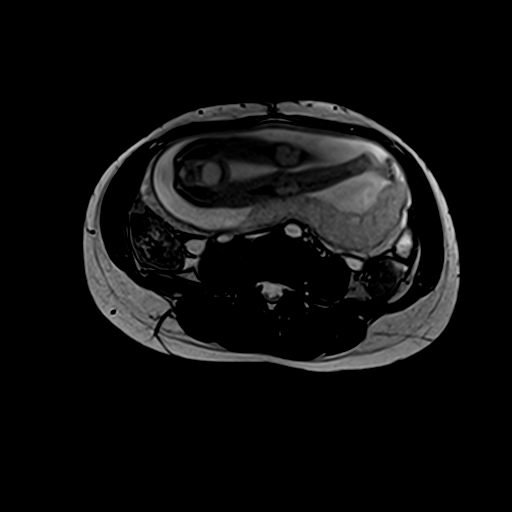

[Series 29: bSSFP · axial · 5.0mm · 1.70mm/px · z∈[-4,+188]mm · 2 of 33 slices shown (3 of 3)]
[im 1/33]
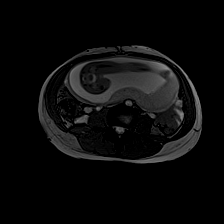
[im 33/33]
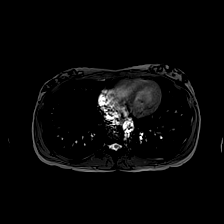

[Series 32: T1 · axial · 3.0mm · 0.74mm/px · z∈[+29,+176]mm · 3 of 42 slices shown (1 of 2)]
[im 1/42]
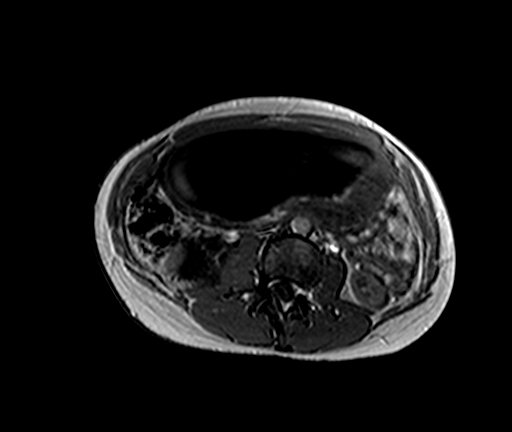
[im 21/42]
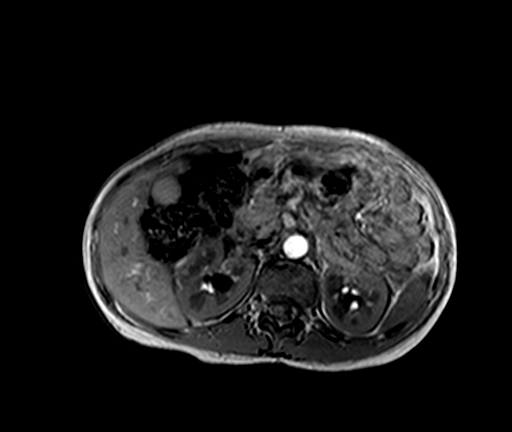
[im 42/42]
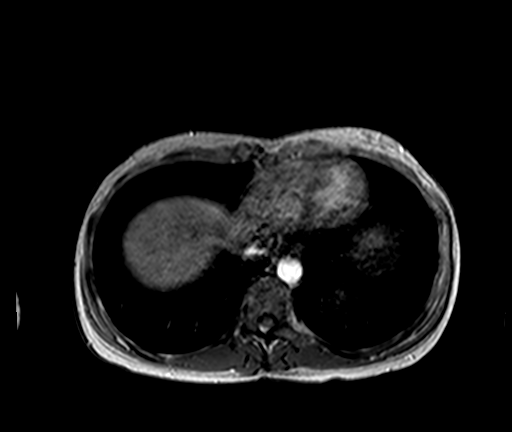

[Series 32: T1 · axial · 6.0mm · 1.48mm/px · z∈[-202,+21]mm · 2 of 32 slices shown (2 of 2)]
[im 1/32]
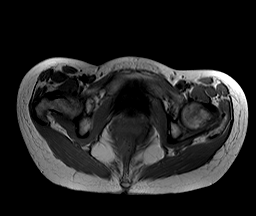
[im 32/32]
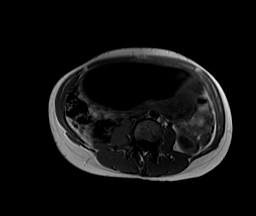

[Series 33: T1 dynamic · axial · 3.0mm · 1.41mm/px · z∈[-19,+194]mm · 4 of 72 slices shown (1 of 2)]
[im 1/72]
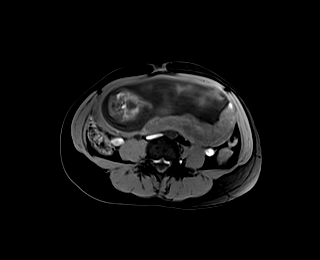
[im 24/72]
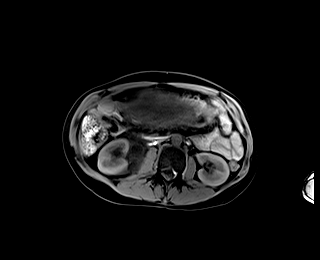
[im 48/72]
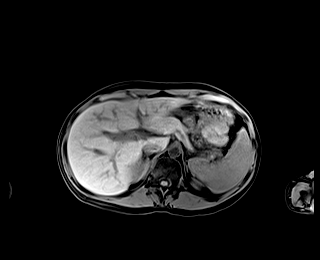
[im 72/72]
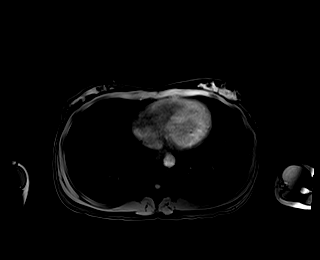

[Series 34: T1 dynamic · axial · 3.0mm · 1.41mm/px · z∈[-205,+8]mm · 4 of 72 slices shown (2 of 2)]
[im 1/72]
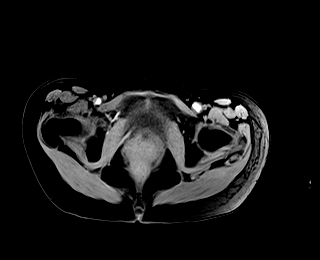
[im 24/72]
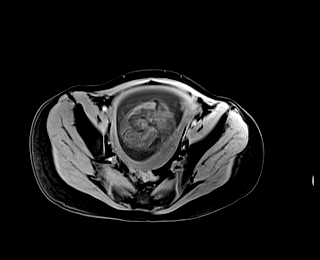
[im 48/72]
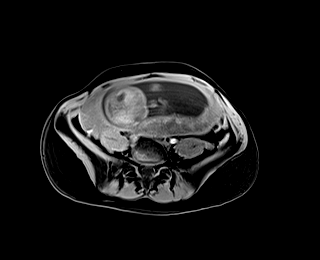
[im 72/72]
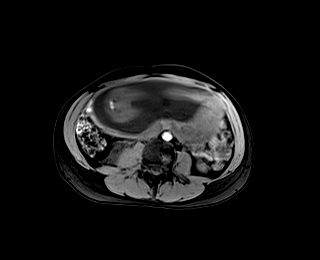

[Series 35: DWI · axial · 6.0mm · 1.42mm/px · z∈[-19,+189]mm · 4 of 60 slices shown (1 of 4)]
[im 1/60]
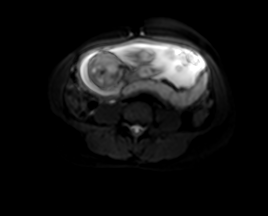
[im 20/60]
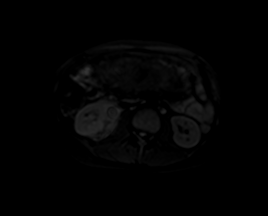
[im 40/60]
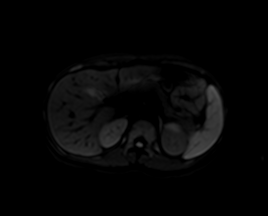
[im 60/60]
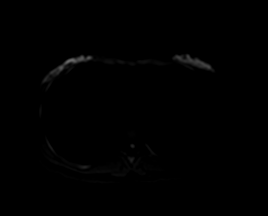

[Series 36: DWI · axial · 6.0mm · 1.42mm/px · z∈[-19,+189]mm · 2 of 30 slices shown (2 of 4)]
[im 1/30]
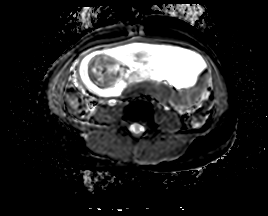
[im 30/30]
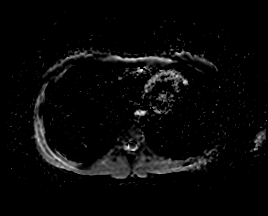

[Series 37: DWI · axial · 6.0mm · 1.42mm/px · z∈[-19,+189]mm · 2 of 30 slices shown (3 of 4)]
[im 1/30]
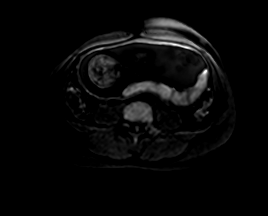
[im 30/30]
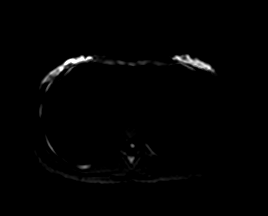

[Series 38: DWI · axial · 6.0mm · 1.42mm/px · z∈[-198,-61]mm · 3 of 60 slices shown (4 of 4)]
[im 1/60]
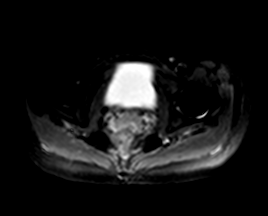
[im 20/60]
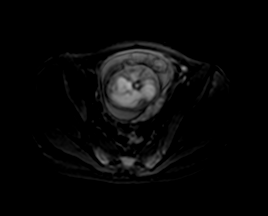
[im 40/60]
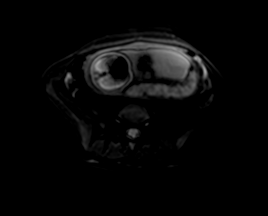

[43 of 48 positions shown; findings below may reference images not displayed]

FINDINGS: COMBINED FINDINGS FOR BOTH MR ABDOMEN AND PELVIS

Lower chest: Unremarkable.

Hepatobiliary: No suspicious focal abnormality in the liver on this
study without intravenous contrast. There is no evidence for
gallstones, gallbladder wall thickening, or pericholecystic fluid.
No intrahepatic or extrahepatic biliary dilation.

Pancreas: No focal mass lesion. No dilatation of the main duct. No
intraparenchymal cyst. No peripancreatic edema.

Spleen:  No splenomegaly. No focal mass lesion.

Adrenals/Urinary Tract: No adrenal nodule or mass. Left kidney and
ureter unremarkable

Moderate right hydronephrosis is associated with mild fullness of
the right ureter down to the level of the pelvis where the ureter
becomes wedged between the uterus in the psoas muscle. There is
fairly extensive edema/fluid medial to the right kidney, round the
right renal pelvis and tracking down along the proximal right
ureter. No filling defect identified to suggest the presence of the
stone although there is central flow artifact in the mid ureter on
axial haste imaging. Bladder is unremarkable although incompletely
visualized.

Stomach/Bowel: Stomach is unremarkable. No gastric wall thickening.
No evidence of outlet obstruction. Duodenum is normally positioned
as is the ligament of Treitz. No small bowel or colonic dilatation
within the visualized abdomen. The terminal ileum is normal. The
cecum is compressed between the uterus in the right pelvic sidewall.
The appendix cannot be discretely identified but there are no
demonstrable substantial features of inflammation in the right lower
quadrant. Colon is nondilated.

Vascular/Lymphatic: No abdominal aortic aneurysm. No evidence for
abdominal or pelvic lymphadenopathy

Reproductive: No adnexal mass. Single intrauterine gestation
evident.

Other:  Very trace free fluid seen in the cul-de-sac.

Musculoskeletal: No suspicious marrow signal abnormality.
IMPRESSION: 1. Moderate to severe right hydronephrosis with mild fullness of the
right ureter down to the level of the pelvis where the ureter
becomes wedged between the uterus and the psoas muscle. There is
fairly extensive edema/fluid medial to the right kidney, around the
right renal pelvis and tracking down along the proximal right
ureter. Central flow artifact noted in the mid right ureter but no
filling defect to suggest the presence of the stone.
2. The cecum is compressed between the uterus and the right pelvic
sidewall. Terminal ileum unremarkable. The appendix cannot be
discretely identified but no overt findings concerning for
inflammation/appendicitis
3. Very trace free fluid in the cul-de-sac, nonspecific.

## 2022-01-29 IMAGING — US US RENAL
1 series · 15 of 25 positions shown · non-contrast
Comparison: None Available.

CLINICAL DATA: Twenty-five weeks pregnant with right flank pain.

EXAM:
RENAL / URINARY TRACT ULTRASOUND COMPLETE

[Series 1: us renal · 50 acquisitions, 15 frames shown]
[im 1/50]
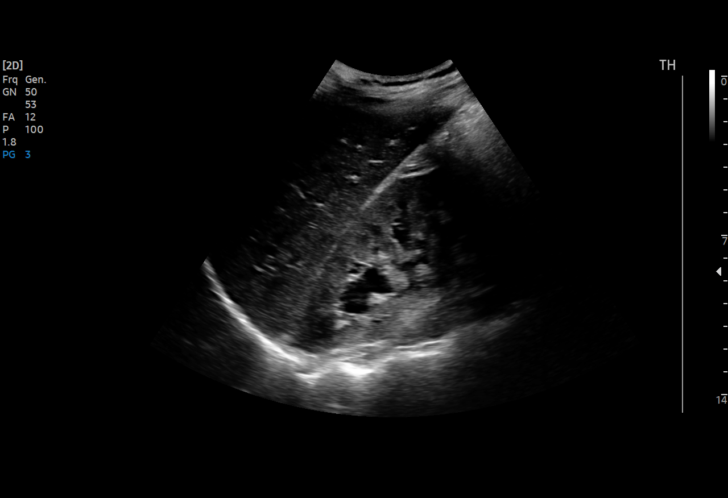
[im 5/50]
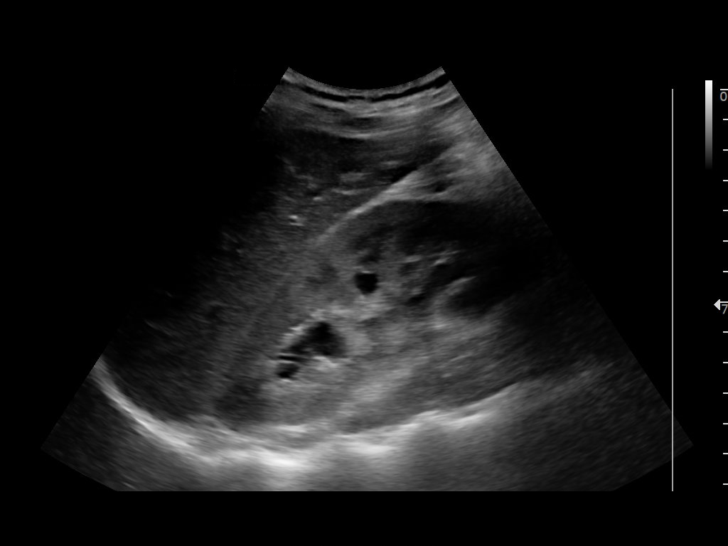
[im 9/50]
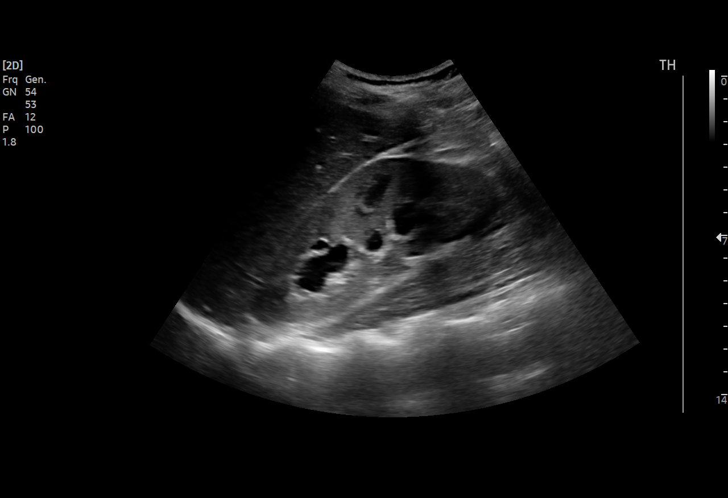
[im 11/50]
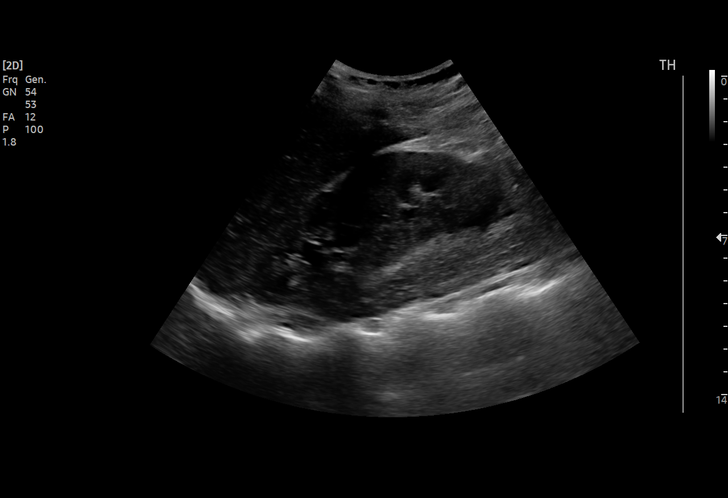
[im 15/50]
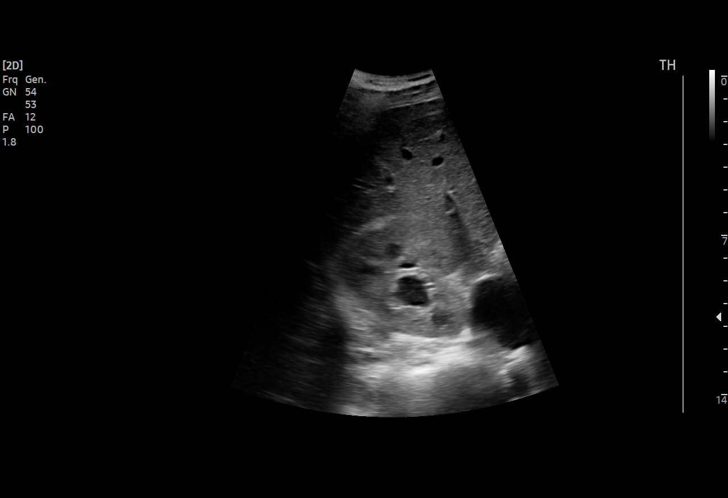
[im 19/50]
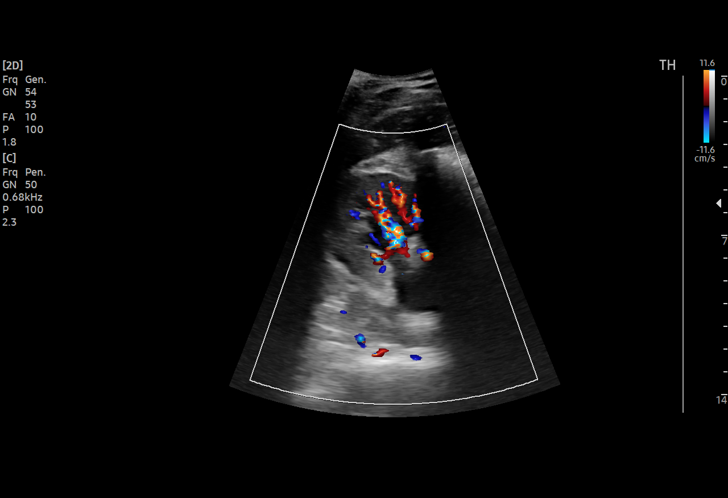
[im 21/50]
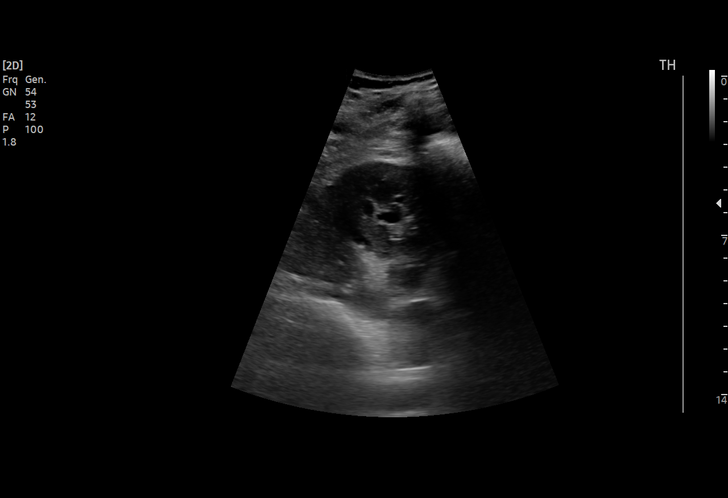
[im 25/50]
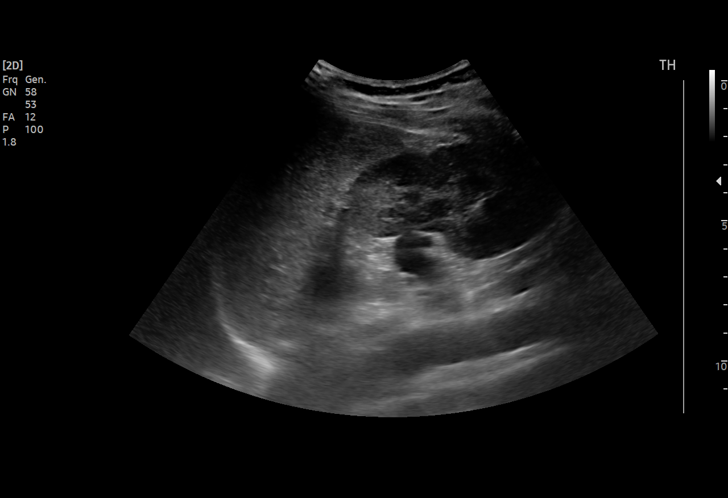
[im 29/50]
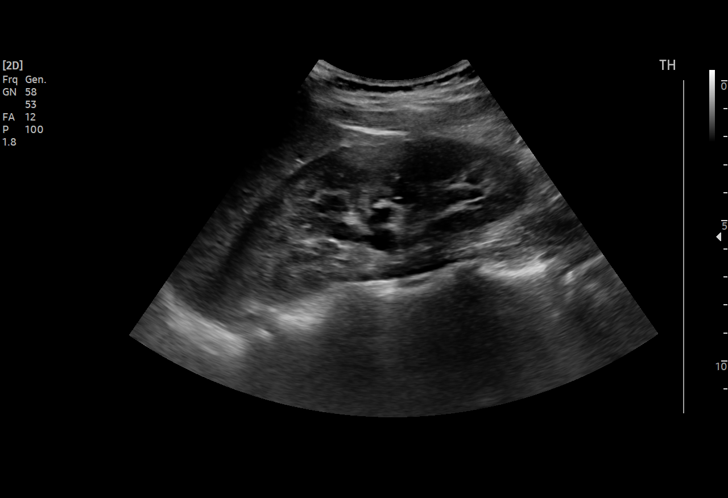
[im 31/50]
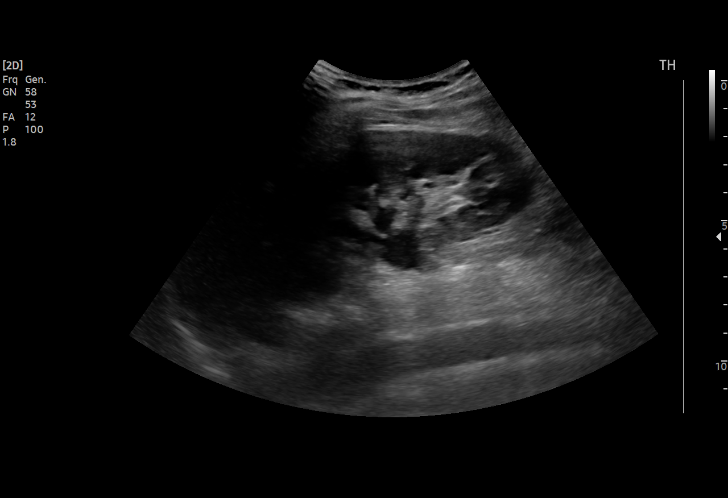
[im 35/50]
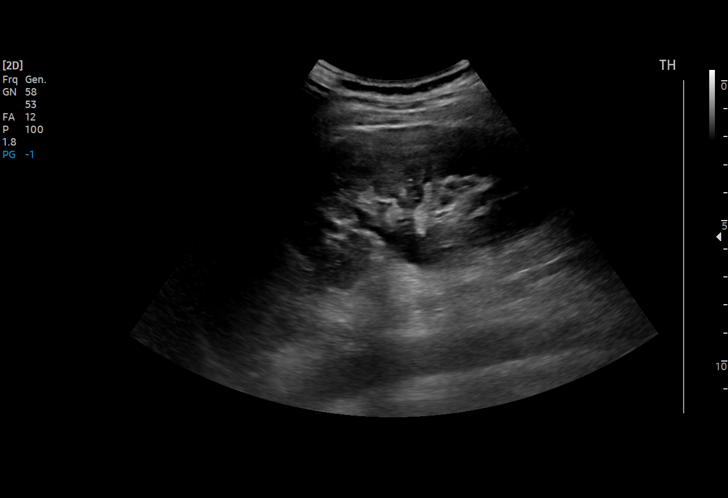
[im 39/50]
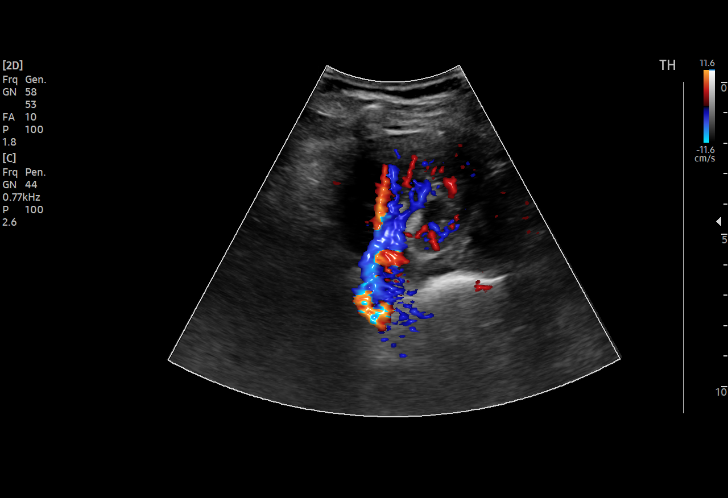
[im 41/50]
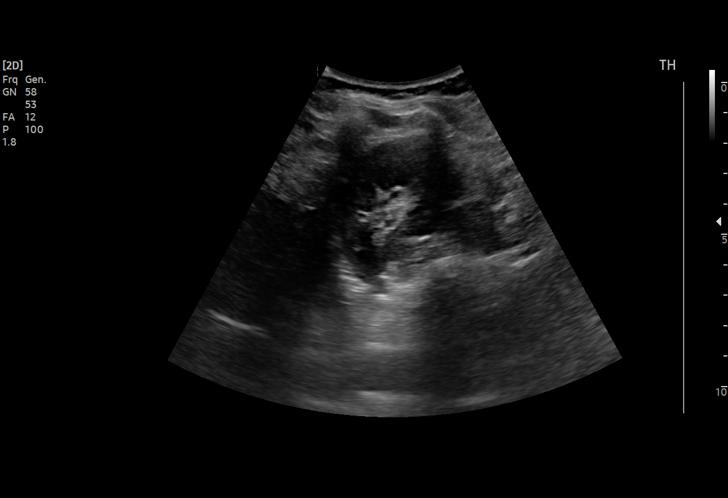
[im 45/50]
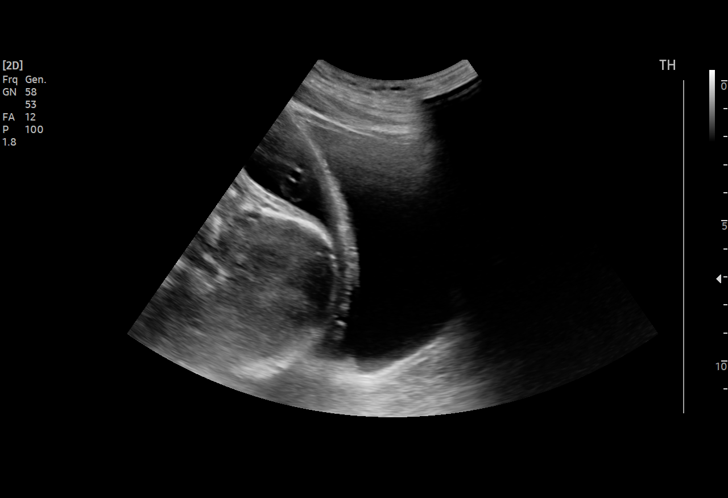
[im 50/50]
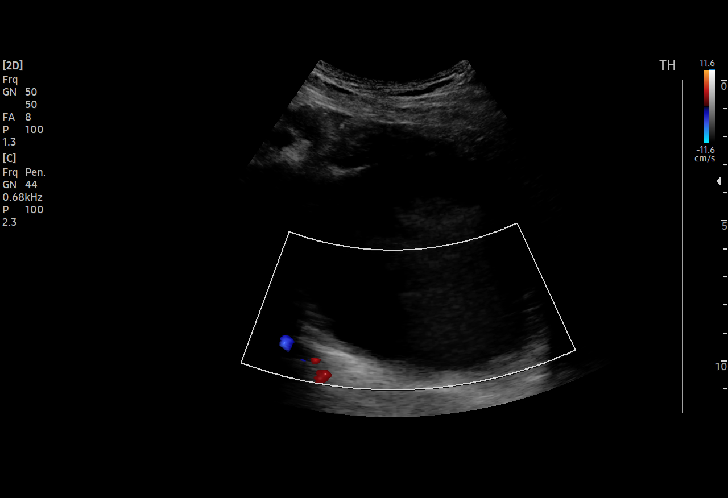

[15 of 25 positions shown; findings below may reference images not displayed]

FINDINGS: Right Kidney:

Renal measurements: 10.8 cm x 4.9 cm x 6.4 cm = volume: 175.0 mL.
Echogenicity within normal limits. No mass is visualized. There is
mild right-sided hydronephrosis.

Left Kidney:

Renal measurements: 10.7 cm x 4.3 cm x 4.6 cm = volume: 108.8 mL.
Echogenicity within normal limits. No mass or hydronephrosis
visualized.

Bladder:

Appears normal for degree of bladder distention. The left ureteral
jet is visualized.

Other:

None.
IMPRESSION: 1. Mild right-sided hydronephrosis.

## 2022-01-29 IMAGING — MR MR PELVIS W/O CM
20 of 22 series · 44 of 48 positions shown · non-contrast
Comparison: Renal ultrasound earlier same day

CLINICAL DATA: 21-year-old female at 25 weeks gestation with right
flank and lower quadrant pain.

EXAM:
MRI ABDOMEN AND PELVIS WITHOUT CONTRAST
TECHNIQUE: Multiplanar multisequence MR imaging of the abdomen and pelvis was
performed. No intravenous contrast was administered.

[Series 3: cor haste · coronal · 5.0mm · 1.04mm/px · 1 of 34 slices shown]
[im 1/34]
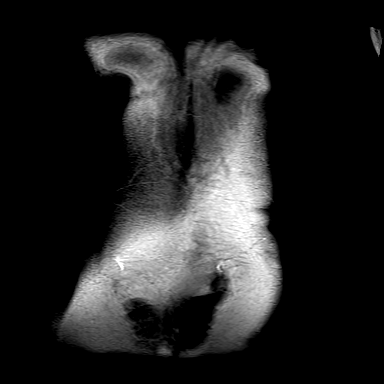

[Series 6: cor haste fs · coronal · 5.0mm · 1.04mm/px · 1 of 15 slices shown (1 of 2)]
[im 1/15]
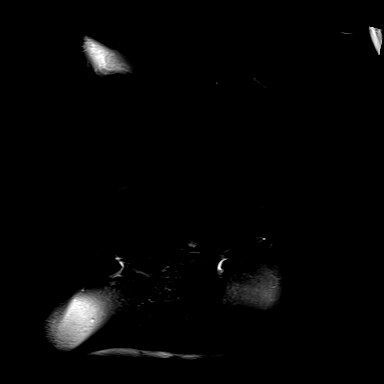

[Series 9: cor haste fs · coronal · 5.0mm · 1.04mm/px · 2 of 34 slices shown (2 of 2)]
[im 1/34]
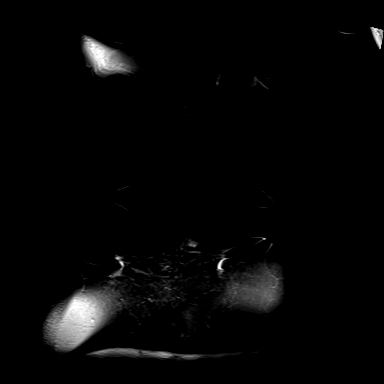
[im 34/34]
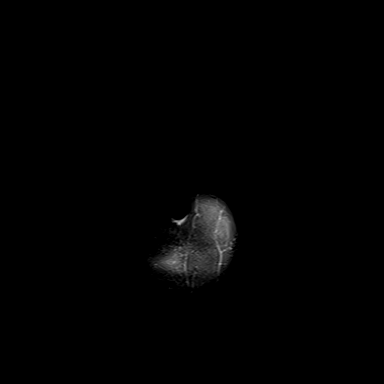

[Series 10: bSSFP · coronal · 5.0mm · 1.79mm/px · 2 of 34 slices shown (1 of 3)]
[im 1/34]
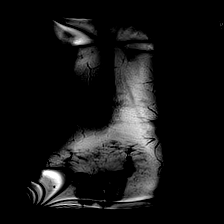
[im 34/34]
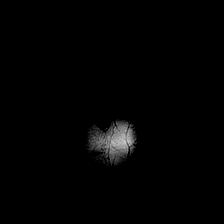

[Series 15: ax haste_comp · axial · 5.0mm · 0.99mm/px · z∈[-4,+188]mm · 2 of 33 slices shown (1 of 2)]
[im 1/33]
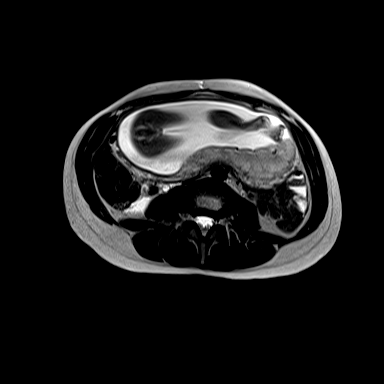
[im 33/33]
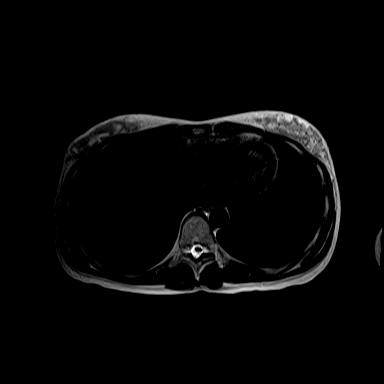

[Series 15: ax haste_comp · axial · 5.0mm · 1.19mm/px · z∈[-202,-10]mm · 2 of 33 slices shown (2 of 2)]
[im 1/33]
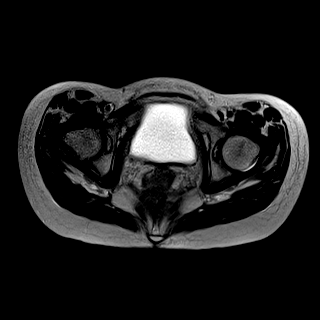
[im 33/33]
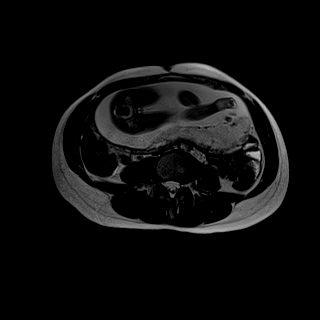

[Series 20: ax haste fs_comp · axial · 5.0mm · 1.19mm/px · z∈[-202,-10]mm · 2 of 33 slices shown (1 of 2)]
[im 1/33]
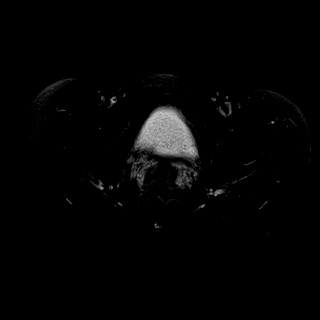
[im 33/33]
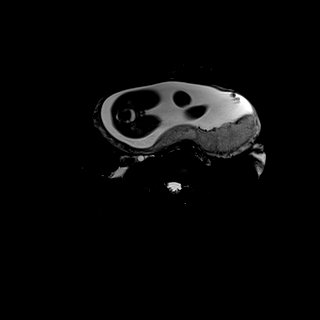

[Series 20: ax haste fs_comp · axial · 5.0mm · 0.99mm/px · z∈[-4,+188]mm · 2 of 33 slices shown (2 of 2)]
[im 1/33]
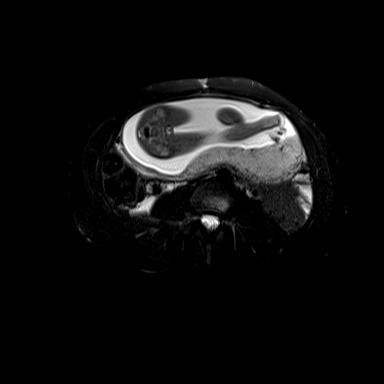
[im 33/33]
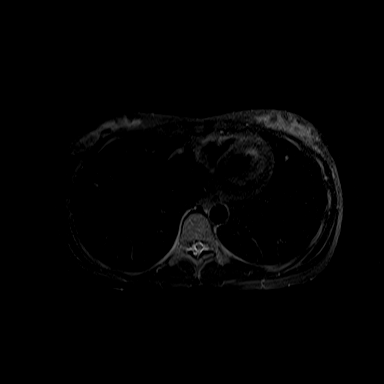

[Series 23: T2 fat-sat · axial · 5.0mm · 1.19mm/px · z∈[-22,+188]mm · 2 of 36 slices shown]
[im 1/36]
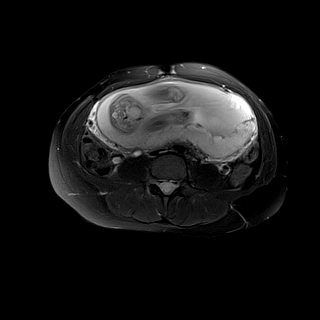
[im 36/36]
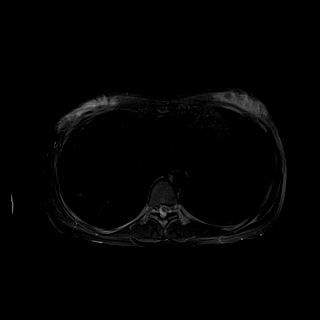

[Series 29: bSSFP · axial · 5.0mm · 1.70mm/px · z∈[-4,+188]mm · 2 of 33 slices shown (2 of 3)]
[im 1/33]
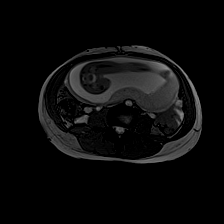
[im 33/33]
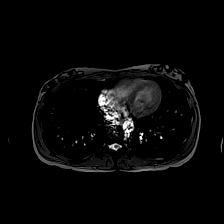

[Series 29: bSSFP · axial · 5.0mm · 0.74mm/px · z∈[-202,-10]mm · 2 of 33 slices shown (3 of 3)]
[im 1/33]
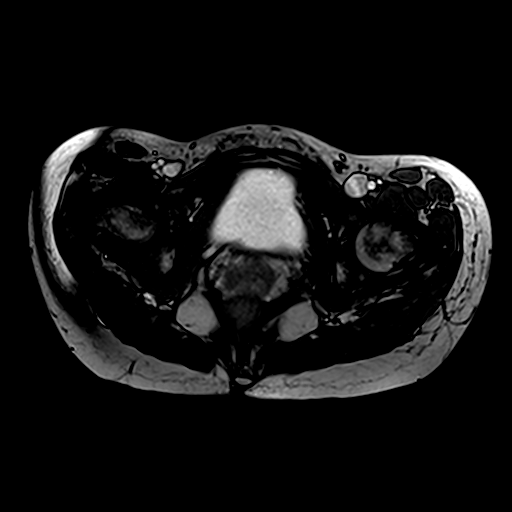
[im 33/33]
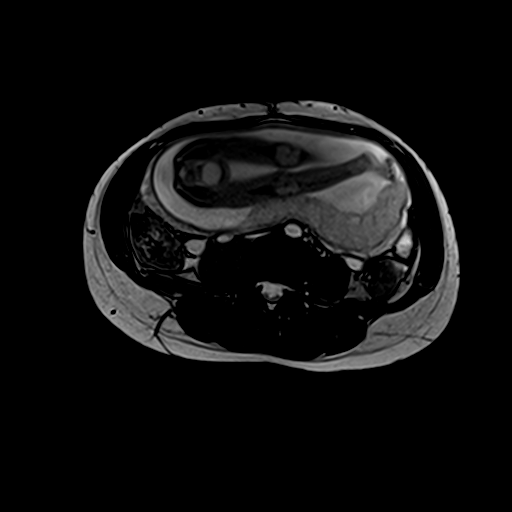

[Series 32: T1 · axial · 6.0mm · 1.48mm/px · z∈[-202,+21]mm · 2 of 32 slices shown (1 of 2)]
[im 1/32]
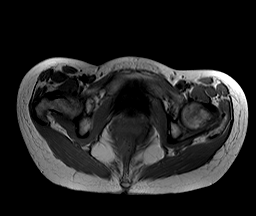
[im 32/32]
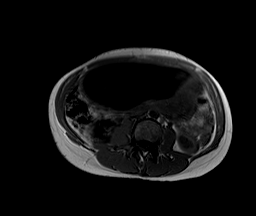

[Series 32: T1 · axial · 3.0mm · 0.74mm/px · z∈[+29,+176]mm · 2 of 42 slices shown (2 of 2)]
[im 1/42]
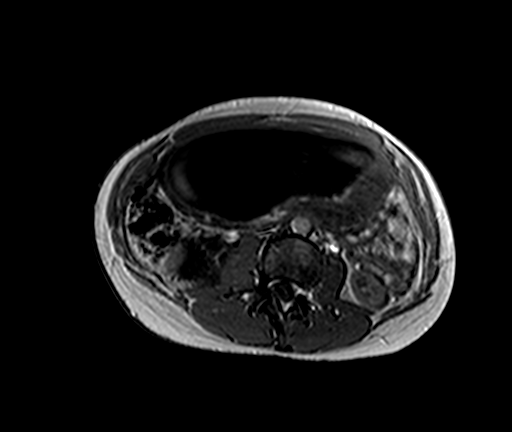
[im 42/42]
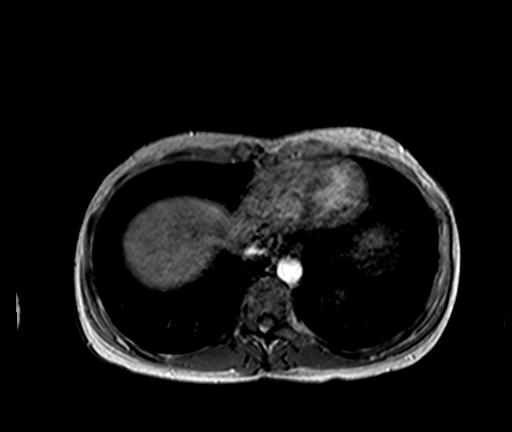

[Series 33: T1 dynamic · axial · 3.0mm · 1.41mm/px · z∈[-19,+194]mm · 4 of 72 slices shown (1 of 2)]
[im 1/72]
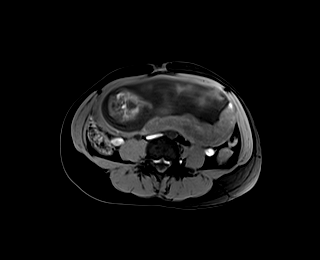
[im 24/72]
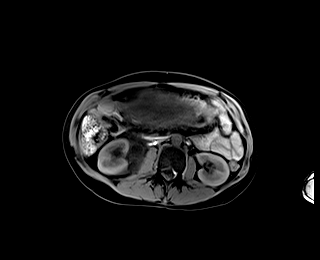
[im 48/72]
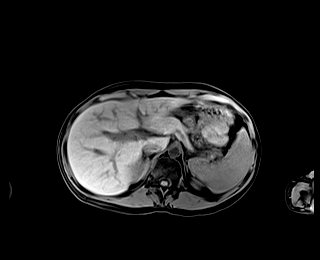
[im 72/72]
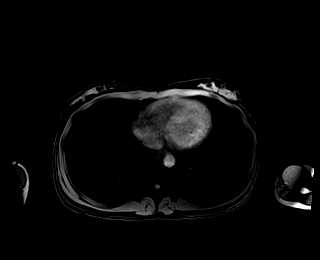

[Series 34: T1 dynamic · axial · 3.0mm · 1.41mm/px · z∈[-205,+8]mm · 4 of 72 slices shown (2 of 2)]
[im 1/72]
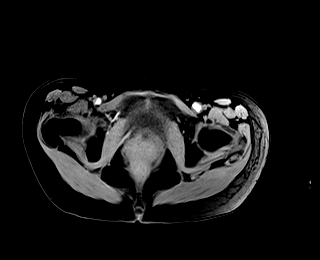
[im 24/72]
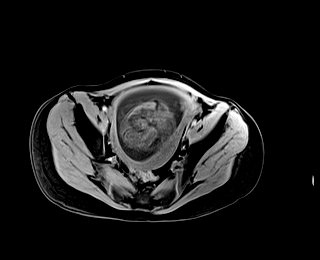
[im 48/72]
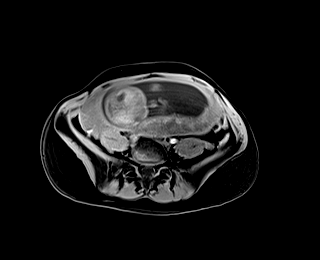
[im 72/72]
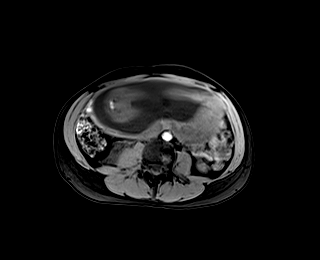

[Series 35: DWI · axial · 6.0mm · 1.42mm/px · z∈[-19,+189]mm · 3 of 60 slices shown (1 of 5)]
[im 1/60]
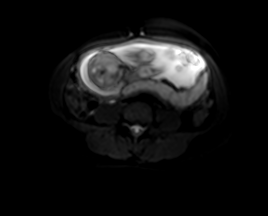
[im 30/60]
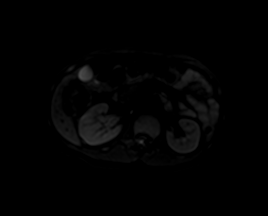
[im 60/60]
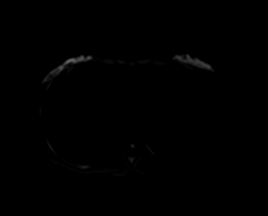

[Series 36: DWI · axial · 6.0mm · 1.42mm/px · z∈[-19,+189]mm · 2 of 30 slices shown (2 of 5)]
[im 1/30]
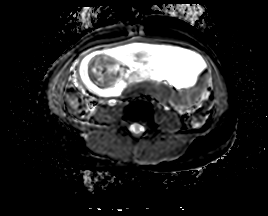
[im 30/30]
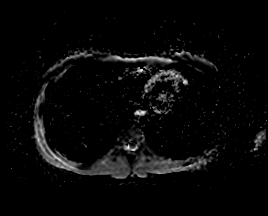

[Series 37: DWI · axial · 6.0mm · 1.42mm/px · z∈[-19,+189]mm · 2 of 30 slices shown (3 of 5)]
[im 1/30]
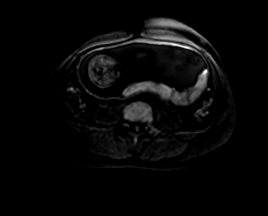
[im 30/30]
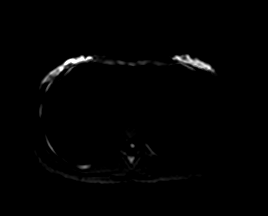

[Series 38: DWI · axial · 6.0mm · 1.42mm/px · z∈[-198,+11]mm · 3 of 60 slices shown (4 of 5)]
[im 1/60]
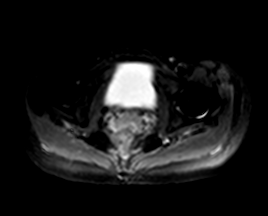
[im 30/60]
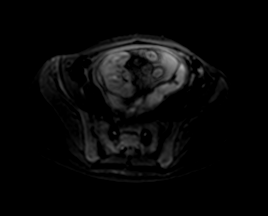
[im 60/60]
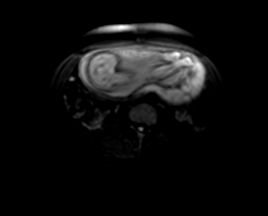

[Series 39: DWI · axial · 6.0mm · 1.42mm/px · z∈[-198,+11]mm · 2 of 30 slices shown (5 of 5)]
[im 1/30]
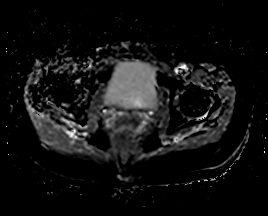
[im 30/30]
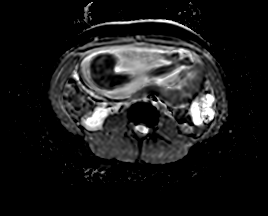

[44 of 48 positions shown; findings below may reference images not displayed]

FINDINGS: COMBINED FINDINGS FOR BOTH MR ABDOMEN AND PELVIS

Lower chest: Unremarkable.

Hepatobiliary: No suspicious focal abnormality in the liver on this
study without intravenous contrast. There is no evidence for
gallstones, gallbladder wall thickening, or pericholecystic fluid.
No intrahepatic or extrahepatic biliary dilation.

Pancreas: No focal mass lesion. No dilatation of the main duct. No
intraparenchymal cyst. No peripancreatic edema.

Spleen:  No splenomegaly. No focal mass lesion.

Adrenals/Urinary Tract: No adrenal nodule or mass. Left kidney and
ureter unremarkable

Moderate right hydronephrosis is associated with mild fullness of
the right ureter down to the level of the pelvis where the ureter
becomes wedged between the uterus in the psoas muscle. There is
fairly extensive edema/fluid medial to the right kidney, round the
right renal pelvis and tracking down along the proximal right
ureter. No filling defect identified to suggest the presence of the
stone although there is central flow artifact in the mid ureter on
axial haste imaging. Bladder is unremarkable although incompletely
visualized.

Stomach/Bowel: Stomach is unremarkable. No gastric wall thickening.
No evidence of outlet obstruction. Duodenum is normally positioned
as is the ligament of Treitz. No small bowel or colonic dilatation
within the visualized abdomen. The terminal ileum is normal. The
cecum is compressed between the uterus in the right pelvic sidewall.
The appendix cannot be discretely identified but there are no
demonstrable substantial features of inflammation in the right lower
quadrant. Colon is nondilated.

Vascular/Lymphatic: No abdominal aortic aneurysm. No evidence for
abdominal or pelvic lymphadenopathy

Reproductive: No adnexal mass. Single intrauterine gestation
evident.

Other:  Very trace free fluid seen in the cul-de-sac.

Musculoskeletal: No suspicious marrow signal abnormality.
IMPRESSION: 1. Moderate to severe right hydronephrosis with mild fullness of the
right ureter down to the level of the pelvis where the ureter
becomes wedged between the uterus and the psoas muscle. There is
fairly extensive edema/fluid medial to the right kidney, around the
right renal pelvis and tracking down along the proximal right
ureter. Central flow artifact noted in the mid right ureter but no
filling defect to suggest the presence of the stone.
2. The cecum is compressed between the uterus and the right pelvic
sidewall. Terminal ileum unremarkable. The appendix cannot be
discretely identified but no overt findings concerning for
inflammation/appendicitis
3. Very trace free fluid in the cul-de-sac, nonspecific.

## 2022-01-29 MED ORDER — TAMSULOSIN HCL 0.4 MG PO CAPS: 0.4000 mg | ORAL_CAPSULE | Freq: Every day | ORAL | 0 refills | Status: DC

## 2022-01-29 MED ORDER — LACTATED RINGERS IV BOLUS
1000.0000 mL | Freq: Once | INTRAVENOUS | Status: AC
  Administered 2022-01-29: 1000 mL via INTRAVENOUS

## 2022-01-29 MED ORDER — OXYCODONE HCL 5 MG PO TABS: 5.0000 mg | ORAL_TABLET | Freq: Four times a day (QID) | ORAL | 0 refills | Status: DC | PRN

## 2022-01-29 MED ORDER — OXYCODONE HCL 5 MG PO TABS: 5.0000 mg | ORAL_TABLET | Freq: Once | ORAL | Status: DC

## 2022-01-29 MED ORDER — PROMETHAZINE HCL 25 MG PO TABS: 25.0000 mg | ORAL_TABLET | Freq: Four times a day (QID) | ORAL | 0 refills | Status: DC | PRN

## 2022-01-29 MED ORDER — ONDANSETRON HCL 4 MG/2ML IJ SOLN
4.0000 mg | Freq: Once | INTRAMUSCULAR | Status: AC
  Administered 2022-01-29: 4 mg via INTRAVENOUS
  Filled 2022-01-29: qty 2

## 2022-01-29 MED ORDER — HYDROMORPHONE HCL 1 MG/ML IJ SOLN
0.5000 mg | Freq: Once | INTRAMUSCULAR | Status: AC
  Administered 2022-01-29: 0.5 mg via INTRAVENOUS
  Filled 2022-01-29: qty 1

## 2022-01-29 MED ORDER — FAMOTIDINE IN NACL 20-0.9 MG/50ML-% IV SOLN
20.0000 mg | Freq: Once | INTRAVENOUS | Status: AC
  Administered 2022-01-29: 20 mg via INTRAVENOUS
  Filled 2022-01-29: qty 50

## 2022-01-29 NOTE — MAU Provider Note (Cosign Needed)
History     [id number]  Arrival date and time: 01/29/22 1612    Chief Complaint  Patient presents with   Back Pain   Abdominal Pain     HPI Yolanda Myers is a 21 y.o. at [redacted]w[redacted]d who presents for right flank pain. Symptoms started yesterday. Reports constant pain in right mid back that radiates to right lower quadrant. Pain is constant. Nothing makes worse. Took tylenol but couldn't keep it down due to vomiting. Has vomited 6+ times today. Hasn't taken antiemetic. Denies fever/chills, abdominal pain, dysuria, hematuria, vaginal bleeding, or LOF. Last BM was yesterday. Reports good fetal movement.  Was seen at a hospital in [place] early this morning & had negative u/a - no imaging done. Saw Dr. Despina Hidden in the office this afternoon - was sent here for further evaluation.    OB History     Gravida  1   Para  0   Term  0   Preterm  0   AB  0   Living  0      SAB  0   IAB  0   Ectopic  0   Multiple  0   Live Births  0           Past Medical History:  Diagnosis Date   Anxiety    Frequent UTI    Heavy menses    Migraine headache    Mild scoliosis     Past Surgical History:  Procedure Laterality Date   NO PAST SURGERIES      Family History  Problem Relation Age of Onset   Asthma Mother    Asthma Maternal Grandmother    Hearing loss Maternal Grandmother    Heart disease Maternal Grandmother    Hypertension Maternal Grandmother    Depression Maternal Grandmother    Seizures Maternal Grandmother    COPD Maternal Grandmother    Multiple sclerosis Maternal Grandmother    Asthma Maternal Grandfather    Hypertension Maternal Grandfather     Allergies  Allergen Reactions   Imitrex [Sumatriptan] Other (See Comments)    flushing   Sulfa Antibiotics     No current facility-administered medications on file prior to encounter.   Current Outpatient Medications on File Prior to Encounter  Medication Sig Dispense Refill   Prenatal Vit-Fe Fumarate-FA  (PRENATAL VITAMIN) 27-0.8 MG TABS Take 1 tablet by mouth daily. 30 tablet 12   terconazole (TERAZOL 7) 0.4 % vaginal cream Place 1 applicator vaginally at bedtime. (Patient not taking: Reported on 01/12/2022) 45 g 0   Blood Pressure Monitor MISC For regular home bp monitoring during pregnancy (Patient not taking: Reported on 12/30/2021) 1 each 0   Elastic Bandages & Supports (COMFORT FIT MATERNITY SUPP MED) MISC 1 each by Does not apply route as needed (apply to lower abdomen). (Patient not taking: Reported on 12/30/2021) 1 each 0   prenatal vitamin w/FE, FA (NATACHEW) 29-1 MG CHEW chewable tablet Take 1 daily (Patient not taking: Reported on 12/30/2021) 100 tablet 3   triamcinolone cream (KENALOG) 0.1 % Apply topically. (Patient not taking: Reported on 10/30/2021)       ROS Pertinent positives and negative per HPI, all others reviewed and negative  Physical Exam   BP 115/75 (BP Location: Right Arm)   Pulse 73   Temp 98.4 F (36.9 C) (Oral)   Resp 17   Ht 5\' 5"  (1.651 m)   Wt 55 kg   LMP 08/05/2021   SpO2 99%  BMI 20.17 kg/m   Patient Vitals for the past 24 hrs:  BP Temp Temp src Pulse Resp SpO2 Height Weight  01/29/22 2314 115/75 98.4 F (36.9 C) Oral 73 17 -- -- --  01/29/22 1956 (!) 115/59 -- -- 73 17 -- -- --  01/29/22 1627 115/72 98.2 F (36.8 C) Oral 96 16 99 % 5\' 5"  (1.651 m) 55 kg    Physical Exam Vitals and nursing note reviewed.  Constitutional:      General: She is in acute distress.     Appearance: She is well-developed and normal weight. She is not ill-appearing or toxic-appearing.  HENT:     Head: Normocephalic and atraumatic.  Pulmonary:     Effort: Pulmonary effort is normal. No respiratory distress.  Abdominal:     Tenderness: There is abdominal tenderness in the right lower quadrant. There is right CVA tenderness. There is no left CVA tenderness, guarding or rebound. Negative signs include Rovsing's sign.  Skin:    General: Skin is warm and dry.   Neurological:     Mental Status: She is alert.      FHT Baseline 145, moderate variability, no accels, no decels Toco: none Cat: 1  Labs Results for orders placed or performed during the hospital encounter of 01/29/22 (from the past 24 hour(s))  Urinalysis, Routine w reflex microscopic Urine, Clean Catch     Status: Abnormal   Collection Time: 01/29/22  4:39 PM  Result Value Ref Range   Color, Urine STRAW (A) YELLOW   APPearance CLEAR CLEAR   Specific Gravity, Urine 1.005 1.005 - 1.030   pH 7.0 5.0 - 8.0   Glucose, UA NEGATIVE NEGATIVE mg/dL   Hgb urine dipstick NEGATIVE NEGATIVE   Bilirubin Urine NEGATIVE NEGATIVE   Ketones, ur 20 (A) NEGATIVE mg/dL   Protein, ur NEGATIVE NEGATIVE mg/dL   Nitrite NEGATIVE NEGATIVE   Leukocytes,Ua NEGATIVE NEGATIVE  CBC     Status: Abnormal   Collection Time: 01/29/22  5:21 PM  Result Value Ref Range   WBC 12.1 (H) 4.0 - 10.5 K/uL   RBC 3.41 (L) 3.87 - 5.11 MIL/uL   Hemoglobin 11.4 (L) 12.0 - 15.0 g/dL   HCT 01/31/22 (L) 62.6 - 94.8 %   MCV 94.4 80.0 - 100.0 fL   MCH 33.4 26.0 - 34.0 pg   MCHC 35.4 30.0 - 36.0 g/dL   RDW 54.6 27.0 - 35.0 %   Platelets 160 150 - 400 K/uL   nRBC 0.0 0.0 - 0.2 %  Comprehensive metabolic panel     Status: Abnormal   Collection Time: 01/29/22  5:21 PM  Result Value Ref Range   Sodium 138 135 - 145 mmol/L   Potassium 3.6 3.5 - 5.1 mmol/L   Chloride 107 98 - 111 mmol/L   CO2 22 22 - 32 mmol/L   Glucose, Bld 87 70 - 99 mg/dL   BUN <5 (L) 6 - 20 mg/dL   Creatinine, Ser 01/31/22 0.44 - 1.00 mg/dL   Calcium 8.9 8.9 - 8.18 mg/dL   Total Protein 6.1 (L) 6.5 - 8.1 g/dL   Albumin 3.2 (L) 3.5 - 5.0 g/dL   AST 21 15 - 41 U/L   ALT 16 0 - 44 U/L   Alkaline Phosphatase 67 38 - 126 U/L   Total Bilirubin 0.8 0.3 - 1.2 mg/dL   GFR, Estimated 29.9 >37 mL/min   Anion gap 9 5 - 15    Imaging >16 RENAL  Result Date: 01/29/2022  CLINICAL DATA:  Twenty-five weeks pregnant with right flank pain. EXAM: RENAL / URINARY TRACT  ULTRASOUND COMPLETE COMPARISON:  None Available. FINDINGS: Right Kidney: Renal measurements: 10.8 cm x 4.9 cm x 6.4 cm = volume: 175.0 mL. Echogenicity within normal limits. No mass is visualized. There is mild right-sided hydronephrosis. Left Kidney: Renal measurements: 10.7 cm x 4.3 cm x 4.6 cm = volume: 108.8 mL. Echogenicity within normal limits. No mass or hydronephrosis visualized. Bladder: Appears normal for degree of bladder distention. The left ureteral jet is visualized. Other: None. IMPRESSION: 1. Mild right-sided hydronephrosis. Electronically Signed   By: Aram Candela M.D.   On: 01/29/2022 19:16    MAU Course  Procedures Lab Orders         Culture, OB Urine         Urinalysis, Routine w reflex microscopic Urine, Clean Catch         CBC         Comprehensive metabolic panel     Meds ordered this encounter  Medications   lactated ringers bolus 1,000 mL   ondansetron (ZOFRAN) injection 4 mg   HYDROmorphone (DILAUDID) injection 0.5 mg   tamsulosin (FLOMAX) 0.4 MG CAPS capsule    Sig: Take 1 capsule (0.4 mg total) by mouth daily.    Dispense:  30 capsule    Refill:  0    Order Specific Question:   Supervising Provider    Answer:   Venora Maples [[id number]]   promethazine (PHENERGAN) 25 MG tablet    Sig: Take 1 tablet (25 mg total) by mouth every 6 (six) hours as needed for nausea or vomiting. Can place vaginally    Dispense:  30 tablet    Refill:  0    Order Specific Question:   Supervising Provider    Answer:   Venora Maples [[id number]]   oxyCODONE (ROXICODONE) 5 MG immediate release tablet    Sig: Take 1 tablet (5 mg total) by mouth every 6 (six) hours as needed for breakthrough pain.    Dispense:  20 tablet    Refill:  0    Order Specific Question:   Supervising Provider    Answer:   Venora Maples [[id number]]   DISCONTD: oxyCODONE (Oxy IR/ROXICODONE) immediate release tablet 5 mg   lactated ringers bolus 1,000 mL   HYDROmorphone (DILAUDID) injection 0.5 mg    famotidine (PEPCID) IVPB 20 mg premix   tamsulosin (FLOMAX) capsule 0.4 mg   promethazine (PHENERGAN) tablet 25 mg   lactated ringers infusion   Imaging Orders         US RENAL         MR ABDOMEN WO CONTRAST         MR PELVIS WO CONTRAST      MDM Pain improved with IV fluids & dilaudid. No further vomiting after IV zofran.   Some right CVA tenderness but negative u/a. Will send urine for culture.  Renal ultrasound shows mild right hydronephrosis. Right jet not commented on.  Dr. Crissie Reese reviewed ultrasound images & evaluated patient. Will treat for kidney stones.   Update - 2045 Patient reports return of pain, worse than before. IV fluids restarted & will give dilaudid & pepcid (states nausea improved but has heartburn) Reviewed with Dr. Vergie Living & will proceed with MRI to r/o appendicitis  MRI - prelim obtained verbally from [facility] radiology - "mild to moderate right hydronephrosis w/ marked edema at renal pelvis & proximal ureter w/ possible super imposed infection.  Appendix not seen, no inflamatory changes at the cecum & distal ileum"  Patient still complaining of pain. Reviewed results with Dr. Vergie Living. Will come speak with patient.  Care turned over to Dr. Vergie Living at [phone].  Judeth Horn, NP 01/30/2022 1:09 AM  Assessment and Plan

## 2022-01-29 NOTE — Telephone Encounter (Signed)
Patient is calling with lower back pain. Patient reports was seen at [facility]. Patient reports taking pain control medication went and had breakfast and couldn't keep her food nor the medicine done. Patient state it hurts to lay on your right side. Patient doesn't know what to do can't get comfortable. Please advise?

## 2022-01-29 NOTE — Telephone Encounter (Signed)
Patient states she has been having mid lower back pain that radiates to her sides that started yesterday around 5pm.  She has also been having nausea and vomiting.  Went to [facility] and kidney stone was ruled out. She was sent home but is still having difficulty laying down and moving. Denies bleeding, cramping or contractions. States she did "lift" the end of her bed two nights ago to get her cat from under the bed but states "the bed is very light". Also started a new job at [employer] recently and has been standing for longer periods of time.  Informed patient she could be having some discomfort in her back from the lifting or standing but since she is having nausea and vomiting as well, we should see her to make sure she is not having contractions. Has appt next Tuesday but will get in today.  Pt agreeable to plan.

## 2022-01-29 NOTE — MAU Provider Note (Incomplete Revision)
History     [id number]  Arrival date and time: 01/29/22 1612    Chief Complaint  Patient presents with   Back Pain   Abdominal Pain     HPI Yolanda Myers is a 21 y.o. at [redacted]w[redacted]d who presents for right flank pain. Symptoms started yesterday. Reports constant pain in right mid back that radiates to right lower quadrant. Pain is constant. Nothing makes worse. Took tylenol but couldn't keep it down due to vomiting. Has vomited 6+ times today. Hasn't taken antiemetic. Denies fever/chills, abdominal pain, dysuria, hematuria, vaginal bleeding, or LOF. Last BM was yesterday. Reports good fetal movement.  Was seen at a hospital in [place] early this morning & had negative u/a - no imaging done. Saw Dr. Despina Hidden in the office this afternoon - was sent here for further evaluation.    OB History     Gravida  1   Para  0   Term  0   Preterm  0   AB  0   Living  0      SAB  0   IAB  0   Ectopic  0   Multiple  0   Live Births  0           Past Medical History:  Diagnosis Date   Anxiety    Frequent UTI    Heavy menses    Migraine headache    Mild scoliosis     Past Surgical History:  Procedure Laterality Date   NO PAST SURGERIES      Family History  Problem Relation Age of Onset   Asthma Mother    Asthma Maternal Grandmother    Hearing loss Maternal Grandmother    Heart disease Maternal Grandmother    Hypertension Maternal Grandmother    Depression Maternal Grandmother    Seizures Maternal Grandmother    COPD Maternal Grandmother    Multiple sclerosis Maternal Grandmother    Asthma Maternal Grandfather    Hypertension Maternal Grandfather     Allergies  Allergen Reactions   Imitrex [Sumatriptan] Other (See Comments)    flushing   Sulfa Antibiotics     No current facility-administered medications on file prior to encounter.   Current Outpatient Medications on File Prior to Encounter  Medication Sig Dispense Refill   Prenatal Vit-Fe Fumarate-FA  (PRENATAL VITAMIN) 27-0.8 MG TABS Take 1 tablet by mouth daily. 30 tablet 12   terconazole (TERAZOL 7) 0.4 % vaginal cream Place 1 applicator vaginally at bedtime. (Patient not taking: Reported on 01/12/2022) 45 g 0   Blood Pressure Monitor MISC For regular home bp monitoring during pregnancy (Patient not taking: Reported on 12/30/2021) 1 each 0   Elastic Bandages & Supports (COMFORT FIT MATERNITY SUPP MED) MISC 1 each by Does not apply route as needed (apply to lower abdomen). (Patient not taking: Reported on 12/30/2021) 1 each 0   prenatal vitamin w/FE, FA (NATACHEW) 29-1 MG CHEW chewable tablet Take 1 daily (Patient not taking: Reported on 12/30/2021) 100 tablet 3   triamcinolone cream (KENALOG) 0.1 % Apply topically. (Patient not taking: Reported on 10/30/2021)       ROS Pertinent positives and negative per HPI, all others reviewed and negative  Physical Exam   BP (!) 115/59 (BP Location: Right Arm)   Pulse 73   Temp 98.2 F (36.8 C) (Oral)   Resp 17   Ht 5\' 5"  (1.651 m)   Wt 55 kg   LMP 08/05/2021   SpO2 99%  BMI 20.17 kg/m   Patient Vitals for the past 24 hrs:  BP Temp Temp src Pulse Resp SpO2 Height Weight  01/29/22 1956 (!) 115/59 -- -- 73 17 -- -- --  01/29/22 1627 115/72 98.2 F (36.8 C) Oral 96 16 99 % 5\' 5"  (1.651 m) 55 kg    Physical Exam Vitals and nursing note reviewed.  Constitutional:      General: She is in acute distress.     Appearance: She is well-developed and normal weight. She is not ill-appearing or toxic-appearing.  HENT:     Head: Normocephalic and atraumatic.  Pulmonary:     Effort: Pulmonary effort is normal. No respiratory distress.  Abdominal:     Tenderness: There is abdominal tenderness in the right lower quadrant. There is right CVA tenderness. There is no left CVA tenderness, guarding or rebound. Negative signs include Rovsing's sign.  Skin:    General: Skin is warm and dry.  Neurological:     Mental Status: She is alert.       FHT Baseline 145, moderate variability, no accels, no decels Toco: none Cat: 1  Labs Results for orders placed or performed during the hospital encounter of 01/29/22 (from the past 24 hour(s))  Urinalysis, Routine w reflex microscopic Urine, Clean Catch     Status: Abnormal   Collection Time: 01/29/22  4:39 PM  Result Value Ref Range   Color, Urine STRAW (A) YELLOW   APPearance CLEAR CLEAR   Specific Gravity, Urine 1.005 1.005 - 1.030   pH 7.0 5.0 - 8.0   Glucose, UA NEGATIVE NEGATIVE mg/dL   Hgb urine dipstick NEGATIVE NEGATIVE   Bilirubin Urine NEGATIVE NEGATIVE   Ketones, ur 20 (A) NEGATIVE mg/dL   Protein, ur NEGATIVE NEGATIVE mg/dL   Nitrite NEGATIVE NEGATIVE   Leukocytes,Ua NEGATIVE NEGATIVE  CBC     Status: Abnormal   Collection Time: 01/29/22  5:21 PM  Result Value Ref Range   WBC 12.1 (H) 4.0 - 10.5 K/uL   RBC 3.41 (L) 3.87 - 5.11 MIL/uL   Hemoglobin 11.4 (L) 12.0 - 15.0 g/dL   HCT 01/31/22 (L) 67.3 - 41.9 %   MCV 94.4 80.0 - 100.0 fL   MCH 33.4 26.0 - 34.0 pg   MCHC 35.4 30.0 - 36.0 g/dL   RDW 37.9 02.4 - 09.7 %   Platelets 160 150 - 400 K/uL   nRBC 0.0 0.0 - 0.2 %  Comprehensive metabolic panel     Status: Abnormal   Collection Time: 01/29/22  5:21 PM  Result Value Ref Range   Sodium 138 135 - 145 mmol/L   Potassium 3.6 3.5 - 5.1 mmol/L   Chloride 107 98 - 111 mmol/L   CO2 22 22 - 32 mmol/L   Glucose, Bld 87 70 - 99 mg/dL   BUN <5 (L) 6 - 20 mg/dL   Creatinine, Ser 01/31/22 0.44 - 1.00 mg/dL   Calcium 8.9 8.9 - 2.99 mg/dL   Total Protein 6.1 (L) 6.5 - 8.1 g/dL   Albumin 3.2 (L) 3.5 - 5.0 g/dL   AST 21 15 - 41 U/L   ALT 16 0 - 44 U/L   Alkaline Phosphatase 67 38 - 126 U/L   Total Bilirubin 0.8 0.3 - 1.2 mg/dL   GFR, Estimated 24.2 >68 mL/min   Anion gap 9 5 - 15    Imaging >34 RENAL  Result Date: 01/29/2022 CLINICAL DATA:  Twenty-five weeks pregnant with right flank pain. EXAM: RENAL / URINARY  TRACT ULTRASOUND COMPLETE COMPARISON:  None Available.  FINDINGS: Right Kidney: Renal measurements: 10.8 cm x 4.9 cm x 6.4 cm = volume: 175.0 mL. Echogenicity within normal limits. No mass is visualized. There is mild right-sided hydronephrosis. Left Kidney: Renal measurements: 10.7 cm x 4.3 cm x 4.6 cm = volume: 108.8 mL. Echogenicity within normal limits. No mass or hydronephrosis visualized. Bladder: Appears normal for degree of bladder distention. The left ureteral jet is visualized. Other: None. IMPRESSION: 1. Mild right-sided hydronephrosis. Electronically Signed   By: Aram Candela M.D.   On: 01/29/2022 19:16    MAU Course  Procedures Lab Orders         Culture, OB Urine         Urinalysis, Routine w reflex microscopic Urine, Clean Catch         CBC         Comprehensive metabolic panel     Meds ordered this encounter  Medications   lactated ringers bolus 1,000 mL   ondansetron (ZOFRAN) injection 4 mg   HYDROmorphone (DILAUDID) injection 0.5 mg   tamsulosin (FLOMAX) 0.4 MG CAPS capsule    Sig: Take 1 capsule (0.4 mg total) by mouth daily.    Dispense:  30 capsule    Refill:  0    Order Specific Question:   Supervising Provider    Answer:   Venora Maples [[id number]]   promethazine (PHENERGAN) 25 MG tablet    Sig: Take 1 tablet (25 mg total) by mouth every 6 (six) hours as needed for nausea or vomiting. Can place vaginally    Dispense:  30 tablet    Refill:  0    Order Specific Question:   Supervising Provider    Answer:   Venora Maples [[id number]]   oxyCODONE (ROXICODONE) 5 MG immediate release tablet    Sig: Take 1 tablet (5 mg total) by mouth every 6 (six) hours as needed for breakthrough pain.    Dispense:  20 tablet    Refill:  0    Order Specific Question:   Supervising Provider    Answer:   Venora Maples [[id number]]   Imaging Orders         US RENAL      MDM Pain improved with IV fluids & dilaudid. No further vomiting after IV zofran.   Some right CVA tenderness but negative u/a. Will send urine for  culture.  Renal ultrasound shows mild right hydronephrosis. Right jet not commented on.  Dr. Crissie Reese reviewed ultrasound images & evaluated patient. Will treat for kidney stones.   Update - 2045 Patient reports return of pain, worse than before. IV fluids restarted & will give dilaudid & pepcid (states nausea improved but has heartburn) Reviewed with Dr. Vergie Living & will proceed with MRI to r/o appendicitis Assessment and Plan   1. Right flank pain   2. [redacted] weeks gestation of pregnancy    -Rx phenergan, oxycodone, & flomax -Urine culture pending -Reviewed reasons to return to MAU  Judeth Horn, NP 01/29/22 8:17 PM

## 2022-01-29 NOTE — MAU Note (Signed)
.  Yolanda Myers is a 21 y.o. at [redacted]w[redacted]d here in MAU reporting: right flank pain that wraps around her right side. Was sent over from the office for evaluation to r/o kidney stones. Also reporting 6 episodes of n/v since yesterday. Denies VB or LOF. +FM.   Pain score: 5 Vitals:   01/29/22 1627  BP: 115/72  Pulse: 96  Resp: 16  Temp: 98.2 F (36.8 C)  SpO2: 99%     FHT:140 Lab orders placed from triage:  UA

## 2022-01-29 NOTE — Discharge Instructions (Signed)
Return to MAU for fever, persistent vomiting, or worsening pain that is not relieved by medication.

## 2022-01-30 LAB — CULTURE, OB URINE
Culture: NO GROWTH
Special Requests: NORMAL

## 2022-01-30 MED ORDER — PROMETHAZINE HCL 25 MG PO TABS
25.0000 mg | ORAL_TABLET | Freq: Once | ORAL | Status: AC
  Administered 2022-01-30: 25 mg via ORAL
  Filled 2022-01-30: qty 1

## 2022-01-30 MED ORDER — LACTATED RINGERS IV SOLN: INTRAVENOUS | Status: DC

## 2022-01-30 MED ORDER — TAMSULOSIN HCL 0.4 MG PO CAPS
0.4000 mg | ORAL_CAPSULE | Freq: Once | ORAL | Status: AC
Start: 2022-01-30 — End: 2022-01-30
  Administered 2022-01-30: 0.4 mg via ORAL
  Filled 2022-01-30: qty 1

## 2022-01-30 MED ORDER — ONDANSETRON HCL 4 MG/2ML IJ SOLN
4.0000 mg | Freq: Once | INTRAMUSCULAR | Status: AC
  Administered 2022-01-30: 4 mg via INTRAVENOUS
  Filled 2022-01-30: qty 2

## 2022-01-30 MED ORDER — HYDROMORPHONE HCL 1 MG/ML IJ SOLN
1.0000 mg | Freq: Once | INTRAMUSCULAR | Status: AC
  Administered 2022-01-30: 1 mg via INTRAVENOUS
  Filled 2022-01-30: qty 1

## 2022-01-30 NOTE — Telephone Encounter (Signed)
Patient states she seems to be hurting worse today than yesterday. She has taken the percocet with slight pain relief.  Informed patient the pain may get worse before it gets better before she passes the stone.  Encouraged to continuing pushing fluids, take the pain medication as prescribed and nausea medication aw well. If pain became unbearable, may need to return to MAU for IV pain medication.  Patient with no further questions.

## 2022-01-30 NOTE — Telephone Encounter (Signed)
Patient is calling needing to speak with someone about her pain. Patient reports she was seen yesterday for office visit and still having pain in her back. Please advise ?

## 2022-01-31 ENCOUNTER — Encounter (HOSPITAL_COMMUNITY): Payer: Self-pay | Admitting: Family Medicine

## 2022-01-31 ENCOUNTER — Inpatient Hospital Stay (HOSPITAL_COMMUNITY)
Admission: AD | Admit: 2022-01-31 | Discharge: 2022-01-31 | Disposition: A | Discharge status: Home / Self Care | Payer: Medicaid Other | Attending: Family Medicine | Admitting: Family Medicine

## 2022-01-31 DIAGNOSIS — R12 Heartburn: Secondary | ICD-10-CM | POA: Insufficient documentation

## 2022-01-31 DIAGNOSIS — R109 Unspecified abdominal pain: Secondary | ICD-10-CM | POA: Insufficient documentation

## 2022-01-31 DIAGNOSIS — O26892 Other specified pregnancy related conditions, second trimester: Secondary | ICD-10-CM

## 2022-01-31 DIAGNOSIS — O26899 Other specified pregnancy related conditions, unspecified trimester: Secondary | ICD-10-CM

## 2022-01-31 DIAGNOSIS — Z3A25 25 weeks gestation of pregnancy: Secondary | ICD-10-CM | POA: Diagnosis not present

## 2022-01-31 LAB — CBC WITH DIFFERENTIAL/PLATELET
Abs Immature Granulocytes: 0.06 10*3/uL (ref 0.00–0.07)
Basophils Absolute: 0 10*3/uL (ref 0.0–0.1)
Basophils Relative: 0 %
Eosinophils Absolute: 0 10*3/uL (ref 0.0–0.5)
Eosinophils Relative: 0 %
HCT: 31 % — ABNORMAL LOW (ref 36.0–46.0)
Hemoglobin: 10.7 g/dL — ABNORMAL LOW (ref 12.0–15.0)
Immature Granulocytes: 1 %
Lymphocytes Relative: 7 %
Lymphs Abs: 0.9 10*3/uL (ref 0.7–4.0)
MCH: 32.5 pg (ref 26.0–34.0)
MCHC: 34.5 g/dL (ref 30.0–36.0)
MCV: 94.2 fL (ref 80.0–100.0)
Monocytes Absolute: 1.1 10*3/uL — ABNORMAL HIGH (ref 0.1–1.0)
Monocytes Relative: 9 %
Neutro Abs: 10.9 10*3/uL — ABNORMAL HIGH (ref 1.7–7.7)
Neutrophils Relative %: 83 %
Platelets: 152 10*3/uL (ref 150–400)
RBC: 3.29 MIL/uL — ABNORMAL LOW (ref 3.87–5.11)
RDW: 13.4 % (ref 11.5–15.5)
WBC: 13 10*3/uL — ABNORMAL HIGH (ref 4.0–10.5)
nRBC: 0 % (ref 0.0–0.2)

## 2022-01-31 LAB — URINALYSIS, ROUTINE W REFLEX MICROSCOPIC
Bilirubin Urine: NEGATIVE
Glucose, UA: NEGATIVE mg/dL
Hgb urine dipstick: NEGATIVE
Ketones, ur: 80 mg/dL — AB
Leukocytes,Ua: NEGATIVE
Nitrite: NEGATIVE
Protein, ur: NEGATIVE mg/dL
Specific Gravity, Urine: 1.011 (ref 1.005–1.030)
pH: 7 (ref 5.0–8.0)

## 2022-01-31 LAB — COMPREHENSIVE METABOLIC PANEL
ALT: 13 U/L (ref 0–44)
AST: 16 U/L (ref 15–41)
Albumin: 2.9 g/dL — ABNORMAL LOW (ref 3.5–5.0)
Alkaline Phosphatase: 63 U/L (ref 38–126)
Anion gap: 10 (ref 5–15)
BUN: 5 mg/dL — ABNORMAL LOW (ref 6–20)
CO2: 21 mmol/L — ABNORMAL LOW (ref 22–32)
Calcium: 8.6 mg/dL — ABNORMAL LOW (ref 8.9–10.3)
Chloride: 101 mmol/L (ref 98–111)
Creatinine, Ser: 0.91 mg/dL (ref 0.44–1.00)
GFR, Estimated: >60 mL/min (ref >60)
Glucose, Bld: 84 mg/dL (ref 70–99)
Potassium: 3.6 mmol/L (ref 3.5–5.1)
Sodium: 132 mmol/L — ABNORMAL LOW (ref 135–145)
Total Bilirubin: 0.8 mg/dL (ref 0.3–1.2)
Total Protein: 5.9 g/dL — ABNORMAL LOW (ref 6.5–8.1)

## 2022-01-31 LAB — LIPASE, BLOOD: Lipase: 30 U/L (ref 11–51)

## 2022-01-31 MED ORDER — PANTOPRAZOLE SODIUM 40 MG PO TBEC
40.0000 mg | DELAYED_RELEASE_TABLET | Freq: Once | ORAL | Status: AC
  Administered 2022-01-31: 40 mg via ORAL
  Filled 2022-01-31: qty 1

## 2022-01-31 MED ORDER — FAMOTIDINE 20 MG PO TABS: 20.0000 mg | ORAL_TABLET | Freq: Every day | ORAL | 0 refills | Status: DC

## 2022-01-31 MED ORDER — CYCLOBENZAPRINE HCL 5 MG PO TABS
10.0000 mg | ORAL_TABLET | Freq: Once | ORAL | Status: AC
  Administered 2022-01-31: 10 mg via ORAL
  Filled 2022-01-31: qty 2

## 2022-01-31 MED ORDER — ACETAMINOPHEN 500 MG PO TABS: 1000.0000 mg | ORAL_TABLET | Freq: Four times a day (QID) | ORAL | 0 refills | Status: DC | PRN

## 2022-01-31 MED ORDER — OXYCODONE HCL 5 MG PO TABS
5.0000 mg | ORAL_TABLET | Freq: Once | ORAL | Status: AC
  Administered 2022-01-31: 5 mg via ORAL
  Filled 2022-01-31: qty 1

## 2022-01-31 MED ORDER — NIFEDIPINE 10 MG PO CAPS
10.0000 mg | ORAL_CAPSULE | ORAL | Status: AC | PRN
  Administered 2022-01-31 (×3): 10 mg via ORAL
  Filled 2022-01-31 (×3): qty 1

## 2022-01-31 MED ORDER — LACTATED RINGERS IV BOLUS
1000.0000 mL | Freq: Once | INTRAVENOUS | Status: AC
  Administered 2022-01-31: 1000 mL via INTRAVENOUS

## 2022-01-31 MED ORDER — FAMOTIDINE IN NACL 20-0.9 MG/50ML-% IV SOLN
20.0000 mg | Freq: Once | INTRAVENOUS | Status: AC
  Administered 2022-01-31: 20 mg via INTRAVENOUS
  Filled 2022-01-31: qty 50

## 2022-02-01 ENCOUNTER — Encounter (HOSPITAL_COMMUNITY): Payer: Self-pay | Admitting: Obstetrics and Gynecology

## 2022-02-01 ENCOUNTER — Inpatient Hospital Stay (HOSPITAL_COMMUNITY)
Admission: AD | Admit: 2022-02-01 | Discharge: 2022-02-01 | Disposition: A | Discharge status: Home / Self Care | Payer: Medicaid Other | Attending: Obstetrics and Gynecology | Admitting: Obstetrics and Gynecology

## 2022-02-01 DIAGNOSIS — R109 Unspecified abdominal pain: Secondary | ICD-10-CM

## 2022-02-01 DIAGNOSIS — Z3401 Encounter for supervision of normal first pregnancy, first trimester: Secondary | ICD-10-CM

## 2022-02-01 DIAGNOSIS — O26892 Other specified pregnancy related conditions, second trimester: Secondary | ICD-10-CM | POA: Diagnosis present

## 2022-02-01 DIAGNOSIS — Z3689 Encounter for other specified antenatal screening: Secondary | ICD-10-CM | POA: Diagnosis not present

## 2022-02-01 DIAGNOSIS — Z3A25 25 weeks gestation of pregnancy: Secondary | ICD-10-CM | POA: Diagnosis not present

## 2022-02-01 LAB — URINALYSIS, ROUTINE W REFLEX MICROSCOPIC
Bilirubin Urine: NEGATIVE
Glucose, UA: NEGATIVE mg/dL
Hgb urine dipstick: NEGATIVE
Ketones, ur: 20 mg/dL — AB
Leukocytes,Ua: NEGATIVE
Nitrite: NEGATIVE
Protein, ur: NEGATIVE mg/dL
Specific Gravity, Urine: 1.009 (ref 1.005–1.030)
pH: 7 (ref 5.0–8.0)

## 2022-02-01 MED ORDER — NIFEDIPINE 10 MG PO CAPS
10.0000 mg | ORAL_CAPSULE | ORAL | Status: DC | PRN
  Administered 2022-02-01: 10 mg via ORAL
  Filled 2022-02-01: qty 1

## 2022-02-01 MED ORDER — OXYCODONE-ACETAMINOPHEN 5-325 MG PO TABS
2.0000 | ORAL_TABLET | Freq: Once | ORAL | Status: AC
  Administered 2022-02-01: 2 via ORAL
  Filled 2022-02-01: qty 2

## 2022-02-02 ENCOUNTER — Ambulatory Visit (INDEPENDENT_AMBULATORY_CARE_PROVIDER_SITE_OTHER): Payer: Medicaid Other | Admitting: Obstetrics & Gynecology

## 2022-02-02 VITALS — BP 110/68 | HR 82 | Wt 125.0 lb

## 2022-02-02 DIAGNOSIS — Z3402 Encounter for supervision of normal first pregnancy, second trimester: Secondary | ICD-10-CM

## 2022-02-02 DIAGNOSIS — N23 Unspecified renal colic: Secondary | ICD-10-CM

## 2022-02-03 ENCOUNTER — Encounter: Payer: Medicaid Other | Admitting: Women's Health

## 2022-02-05 ENCOUNTER — Ambulatory Visit: Payer: Medicaid Other | Admitting: Clinical

## 2022-02-05 DIAGNOSIS — Z91199 Patient's noncompliance with other medical treatment and regimen due to unspecified reason: Secondary | ICD-10-CM

## 2022-02-09 ENCOUNTER — Encounter: Payer: Self-pay | Admitting: Obstetrics & Gynecology

## 2022-02-09 ENCOUNTER — Telehealth: Payer: Self-pay | Admitting: Clinical

## 2022-02-09 ENCOUNTER — Encounter: Payer: Medicaid Other | Admitting: Obstetrics & Gynecology

## 2022-02-09 NOTE — Telephone Encounter (Signed)
Attempt to return call to reschedule missed appointment; unable to leave message as mailbox is full.

## 2022-02-11 ENCOUNTER — Ambulatory Visit (HOSPITAL_BASED_OUTPATIENT_CLINIC_OR_DEPARTMENT_OTHER): Payer: Medicaid Other | Attending: Advanced Practice Midwife | Admitting: Physical Therapy

## 2022-02-11 ENCOUNTER — Encounter: Payer: Medicaid Other | Admitting: Advanced Practice Midwife

## 2022-02-11 ENCOUNTER — Encounter (HOSPITAL_BASED_OUTPATIENT_CLINIC_OR_DEPARTMENT_OTHER): Payer: Self-pay | Admitting: Physical Therapy

## 2022-02-11 DIAGNOSIS — M542 Cervicalgia: Secondary | ICD-10-CM | POA: Insufficient documentation

## 2022-02-11 DIAGNOSIS — M545 Low back pain, unspecified: Secondary | ICD-10-CM | POA: Diagnosis not present

## 2022-02-11 NOTE — Therapy (Addendum)
OUTPATIENT PHYSICAL THERAPY TREATMENT NOTE   [name] MRN: [id number] DOB:07-30-2001, 21 y.o., female Today's Date: 02/11/2022  PCP: Wannetta Sender, FNP REFERRING PROVIDER: Drue Second I*  END OF SESSION:   PT End of Session - 02/11/22 1202     Visit Number 4    Number of Visits 12    Authorization Type [place] Medicaid [employer]    PT Start Time 1200    PT Stop Time 1238    PT Time Calculation (min) 38 min    Activity Tolerance Patient tolerated treatment well    Behavior During Therapy [facility] for tasks assessed/performed           Start time:1528 Time [id number] Total time: 39 min Pt tolerated treatment well. Behavior wfl  Past Medical History:  Diagnosis Date   Anxiety    Frequent UTI    Heavy menses    Migraine headache    Mild scoliosis    Past Surgical History:  Procedure Laterality Date   NO PAST SURGERIES     Patient Active Problem List   Diagnosis Date Noted   Supervision of normal first pregnancy 10/30/2021   Lymph node enlargement 06/05/2020   Depression, major, single episode, mild ([place]) 08/20/2019   Migraine without aura and without status migrainosus, not intractable 04/01/2018   Episodic tension-type headache, not intractable 04/01/2018   Syncopal seizure ([place]) 04/01/2018   Closed head injury with brief loss of consciousness ([place]) 04/01/2018   Adolescent idiopathic scoliosis of thoracolumbar region 04/01/2018   Nonintractable episodic headache 03/17/2018   Syncope 03/17/2018   Depression 03/02/2018   Curvature of spine 03/02/2018   Moderate single current episode of major depressive disorder ([place]) 07/08/2016   ACHILLES TENDINITIS 11/10/2009    REFERRING DIAG: REFERRING DIAG:  Z3A.12 (ICD-10-CM) - [redacted] weeks gestation of pregnancy  F41.9,F32.A (ICD-10-CM) - Anxiety and depression  M41.125 (ICD-10-CM) - Adolescent idiopathic scoliosis of thoracolumbar region    THERAPY DIAG:  Pain, lumbar  region  Cervicalgia  PERTINENT HISTORY: Hyperthyroidism, depression,  PRECAUTIONS: pregnancy, no estim  SUBJECTIVE: [redacted] weeks along. Kidney stones set me back. Started working at [employer] around the middle of June. Requires standing which was hard.   PAIN:  PAIN:  Are you having pain? Yes: NPRS scale: 2/10 Pain location: middle of back Pain description: tension Aggravating factors: sitting still Relieving factors: going to the sides     OBJECTIVE: ALL TESTING COMPLETED AT INITIAL EVALUATION UNLESS OTHERWISE DATED.    DIAGNOSTIC FINDINGS:    Mild convex rightward scoliosis of the lower thoracic spine has its apex at the T8 vertebral body. Measuring from the T5 inferior endplate to the D62 pedicles, curvature measures 15.1 degrees convex right.   Compensatory convex leftward scoliosis of the lumbar spine noted with apex at the L1 vertebral body. Measuring from the T12 pedicles to the L3 pedicles, curvature is 22.7 degrees convex leftward.   IMPRESSION: Thoracolumbar scoliosis as above.   PATIENT SURVEYS:  Oswestry Score:  EVAL: 15 / 50 or 30 % 02/11/22:  12/50   SCREENING FOR RED FLAGS: No findings   COGNITION:           Overall cognitive status: Difficulty to assess                              SENSATION: WFL   MUSCLE LENGTH: Hamstrings: >20 deg limitation in supine 90/90   POSTURE:  02/12/22: pt  demo appropriate upright posture in seated without back support   PALPATION: Signficant TTP of L T/S and lumbar paraspinals TTP into cervical paraspinals    LUMBAR ROM:    Active  A/PROM  11/13/2021 AROM 02/11/22  Flexion 50% with R deviation Distal shin, pulling across lower back  Extension [facility] with L devation; R SIJ pain   Right lateral flexion [employer] Joint line  Left lateral flexion 75% 75%  Right rotation WFL p! Into shoulder blade WFL, tight  Left rotation WFL p! Into shoulder blade WFL, tight   (Blank rows = not tested)   LE ROM:  EVAL: WFL limits  throughtout but with pain into hip flexion, ER, and extension at the L SIJ and  02/11/22: WFL and pt denied pain, did have some Left-sided lumbar pulling in left hip flexion   LE MMT:   MMT Right 11/13/2021 Left 11/13/2021 Right/Left  Hip flexion 4/5 p! 4/5 p! 4/5 // 4/5  Hip extension       Hip abduction 4/5 4/5 5/5 // 4+/5  Hip adduction 4/5 4/5   Hip internal rotation       Hip external rotation 4/5 4/5    (Blank rows = not tested)        GAIT: WFL for distance observed, pt reports significant fatigue at about 20 min that increases her back pain in daily ambulation       TODAY'S TREATMENT  7/5: MANUAL: Lt-sidelying STM to Rt lumbar paraspinals and QL Cat/camel Child pose- advised of modifications as pregnancy progresses Alt UE reaches in qped, alt LE reaches, added both together for bird dog Sumo squats     PATIENT EDUCATION:  Education details: Geophysicist/field seismologist of condition, POC, HEP, exercise form/rationale  Person educated: Patient Education method: Explanation, Demonstration, Tactile cues, Verbal cues, and Handouts Education comprehension: verbalized understanding, returned demonstration, verbal cues required, and tactile cues required         HOME EXERCISE PROGRAM: Access Code: [email or web address] URL: [email or web address]    ASSESSMENT:   CLINICAL IMPRESSION: Pt demo improved strength through hips with less pain reported in movement. She does have tightness in lumbar musculature that is eased with the stretches/exercises. Increasing fetus size seems to be providing support to her flexible scoliosis as her alignment is more midline- also providing stretch to Rt QL and paraspinals. She will continue to benefit from skilled PT until her delivery date to manage pain as pregnancy progresses and improve stability for post partum care.      OBJECTIVE IMPAIRMENTS Abnormal gait, decreased activity tolerance, decreased endurance, decreased mobility, difficulty walking,  decreased ROM, decreased strength, hypomobility, increased muscle spasms, impaired flexibility, improper body mechanics, postural dysfunction, and pain.    ACTIVITY LIMITATIONS cleaning, community activity, driving, occupation, medication management, yard work, shopping, and exercise .    PERSONAL FACTORS Behavior pattern, Fitness, Time since onset of injury/illness/exacerbation, and 1-2 comorbidities:    are also affecting patient's functional outcome.      REHAB POTENTIAL: Good   CLINICAL DECISION MAKING: Evolving/moderate complexity   EVALUATION COMPLEXITY: Moderate     GOALS:     SHORT TERM GOALS: Target date: 12/25/2021   Pt will become independent with HEP in order to demonstrate synthesis of PT education.    Goal status: achieved   2.  Pt will report at least 2 pt reduction on NPRS scale for pain in order to demonstrate functional improvement with household activity, self care, and ADL.    Goal status: achieved   3.  Pt will be able to demonstrate ability to perform supine to sit to stand transfers without pain in order to demonstrate functional improvement in lumbopelvic function for self-care and house hold duties.   Goal status: achieved     LONG TERM GOALS: Target date: 05/15/22   Pt  will become independent with final HEP in order to demonstrate synthesis of PT education.   Baseline: will require progression and skilled monitoring as pregnancy progresses Goal status: ongoing   2.  Pt will be able to demonstrate/report ability to walk >20s mins without pain in order to demonstrate functional improvement and tolerance to exercise and community mobility.    Baseline: ongoing, can walk about 20 min but has significant pain that limits her functional activity Goal status: INITIAL   3.  Pt will demonstrate at least a 12.8 improvement in Oswestry Index in order to demonstrate a clinically significant change in LBP and function.    Baseline: ongoing Goal status:  INITIAL   4.  Pt will be able to demonstrate/report ability to sit/stand/sleep for extended periods of time without pain in order to demonstrate functional improvement and tolerance to static positioning.    Goal status: achieved   5.  Pt will be able to lift/squat/hold >15 lbs in order to demonstrate functional improvement in lumbopelvic strength for return to PLOF and prevention of further functional decline.   BAseline: ongoing Goal status: INITIAL     PLAN: PT FREQUENCY: 1-2x/week   PT DURATION: 12 weeks (likely D/C by 8 wks)   PLANNED INTERVENTIONS: Therapeutic exercises, Therapeutic activity, Neuromuscular re-education, Balance training, Gait training, Patient/Family education, Joint mobilization, Aquatic Therapy, Dry Needling, Electrical stimulation, Spinal mobilization, Cryotherapy, Moist heat, Taping, Vasopneumatic device, and Manual therapy.   PLAN FOR NEXT SESSION:cont manual & gross lumbopelvic stabilization     Stephanie Littman C. Tashara Suder PT, DPT 02/11/22 1:12 PM  PHYSICAL THERAPY DISCHARGE SUMMARY  Visits from Start of Care: 4  Current functional level related to goals / functional outcomes: See above   Remaining deficits: See above   Education / Equipment: Anatomy of condition, POC, HEP, exercise form/rationale    Patient agrees to discharge. Patient goals were partially met. Patient is being discharged due to not returning since the last visit. Shera Laubach C. Brittinie Wherley PT, DPT 05/29/22 12:19 PM

## 2022-02-12 ENCOUNTER — Ambulatory Visit (HOSPITAL_BASED_OUTPATIENT_CLINIC_OR_DEPARTMENT_OTHER): Payer: Medicaid Other | Admitting: Physical Therapy

## 2022-02-20 ENCOUNTER — Other Ambulatory Visit: Payer: Self-pay | Admitting: Obstetrics & Gynecology

## 2022-02-20 DIAGNOSIS — O365920 Maternal care for other known or suspected poor fetal growth, second trimester, not applicable or unspecified: Secondary | ICD-10-CM

## 2022-02-23 ENCOUNTER — Other Ambulatory Visit: Payer: Self-pay | Admitting: Obstetrics & Gynecology

## 2022-02-23 ENCOUNTER — Ambulatory Visit (INDEPENDENT_AMBULATORY_CARE_PROVIDER_SITE_OTHER): Payer: Medicaid Other

## 2022-02-23 ENCOUNTER — Encounter: Payer: Self-pay | Admitting: Obstetrics & Gynecology

## 2022-02-23 ENCOUNTER — Ambulatory Visit (INDEPENDENT_AMBULATORY_CARE_PROVIDER_SITE_OTHER): Payer: Medicaid Other | Admitting: Obstetrics & Gynecology

## 2022-02-23 ENCOUNTER — Other Ambulatory Visit: Payer: Medicaid Other

## 2022-02-23 VITALS — BP 113/71 | HR 83 | Wt 122.8 lb

## 2022-02-23 DIAGNOSIS — Z3A28 28 weeks gestation of pregnancy: Secondary | ICD-10-CM

## 2022-02-23 DIAGNOSIS — O365921 Maternal care for other known or suspected poor fetal growth, second trimester, fetus 1: Secondary | ICD-10-CM

## 2022-02-23 DIAGNOSIS — O365920 Maternal care for other known or suspected poor fetal growth, second trimester, not applicable or unspecified: Secondary | ICD-10-CM | POA: Diagnosis not present

## 2022-02-23 DIAGNOSIS — Z87442 Personal history of urinary calculi: Secondary | ICD-10-CM | POA: Insufficient documentation

## 2022-02-23 DIAGNOSIS — Z3403 Encounter for supervision of normal first pregnancy, third trimester: Secondary | ICD-10-CM

## 2022-02-23 DIAGNOSIS — Z3402 Encounter for supervision of normal first pregnancy, second trimester: Secondary | ICD-10-CM

## 2022-02-23 DIAGNOSIS — Z131 Encounter for screening for diabetes mellitus: Secondary | ICD-10-CM

## 2022-02-23 DIAGNOSIS — O0993 Supervision of high risk pregnancy, unspecified, third trimester: Secondary | ICD-10-CM

## 2022-02-23 DIAGNOSIS — O365990 Maternal care for other known or suspected poor fetal growth, unspecified trimester, not applicable or unspecified: Secondary | ICD-10-CM

## 2022-02-23 NOTE — Progress Notes (Signed)
[place] 28+6 wks,cephalic,posterior placenta gr 0,FHR 135 bpm,CX  3.1 cm.,AFI 18.9 cm,BPP 8/8,RI .63,.71,.68,.71=72%,EFW 1118 g 9.4%,AC 9.4%

## 2022-02-23 NOTE — Progress Notes (Signed)
LOW-RISK PREGNANCY VISIT [name] MRN [id number]  Date of birth: Dec 13, 2000 Chief Complaint:   Routine Prenatal Visit  History of Present Illness:   Yolanda Myers is a 21 y.o. G23P0000 female at [redacted]w[redacted]d with an Estimated Date of Delivery: 05/12/22 being seen today for ongoing management of a low-risk pregnancy.   -Kidney stone: seen in MAU, Rx Flomax and pain management     02/23/2022   12:13 PM 10/30/2021    3:35 PM 08/15/2019    2:20 PM 03/02/2018    9:48 AM 11/17/2016   12:53 PM  Depression screen PHQ 2/9  Decreased Interest 0 1 3 1 1   Down, Depressed, Hopeless 1 1 1 2 2   PHQ - 2 Score 1 2 4 3 3   Altered sleeping 1 2 0 1 1  Tired, decreased energy 1 1 1 2 3   Change in appetite 0 0 1 0 0  Feeling bad or failure about yourself  1 1 1  0 2  Trouble concentrating 0 0 0 0 1  Moving slowly or fidgety/restless 0 0 0 0 0  Suicidal thoughts 0 0 1 0 1  PHQ-9 Score 4 6 8 6 11   Difficult doing work/chores   Somewhat difficult Somewhat difficult     Today she reports no complaints. Contractions: Irritability. Vag. Bleeding: None.  Movement: Present. denies leaking of fluid.  Previously was having pain due to kidney stone, which has now resolved and has discontinued all medication.  Review of Systems:   Pertinent items are noted in HPI Denies abnormal vaginal discharge w/ itching/odor/irritation, headaches, visual changes, shortness of breath, chest pain, abdominal pain, severe nausea/vomiting, or problems with urination or bowel movements unless otherwise stated above. Pertinent History Reviewed:  Reviewed past medical,surgical, social, obstetrical and family history.  Reviewed problem list, medications and allergies.  Physical Assessment:   Vitals:   02/23/22 1113  BP: 113/71  Pulse: 83  Weight: 122 lb 12.8 oz (55.7 kg)  Body mass index is 20.43 kg/m.        Physical Examination:   General appearance: Well appearing, and in no distress  Mental status:  Alert, oriented to person, place, and time  Skin: Warm & dry  Respiratory: Normal respiratory effort, no distress  Abdomen: Soft, gravid, nontender  Pelvic: Cervical exam deferred         Extremities: Edema: None  Psych:  mood and affect appropriate  Fetal Status:     Movement: Present  28+6 wks,cephalic,posterior placenta gr 0,FHR 135 bpm,CX  3.1 cm.,AFI 18.9 cm,BPP 8/8,RI .63,.71,.68,.71=72%,EFW 1118 g 9.4%,AC 9.4%                               Chaperone: n/a    No results found for this or any previous visit (from the past 24 hour(s)).   Assessment & Plan:  1) Low-risk pregnancy G1P0000 at [redacted]w[redacted]d with an Estimated Date of Delivery: 05/12/22   2) Reviewed today's report- findings concerning for FGR -reviewed these findings including antepartum monitoring and early delivery -plan for BPP weekly @ 32wks -Growth q 3-4wks -Dopplers weekly   Meds: No orders of the defined types were placed in this encounter.  Labs/procedures today: growth/dopplers  Plan:  Continue routine obstetrical care  Next visit: prefers in person    Reviewed: Preterm labor symptoms and general obstetric precautions including but not limited to vaginal bleeding, contractions, leaking of fluid and fetal  movement were reviewed in detail with the patient.  All questions were answered.   Follow-up: Return in about 2 weeks (around 03/09/2022) for weekly dopplers with HROB visit and growth in 4wks.  No orders of the defined types were placed in this encounter.   Myna Hidalgo, DO Attending Obstetrician & Gynecologist, [facility] for [employer], [facility]

## 2022-02-24 ENCOUNTER — Other Ambulatory Visit: Payer: Medicaid Other

## 2022-02-24 DIAGNOSIS — Z3A28 28 weeks gestation of pregnancy: Secondary | ICD-10-CM

## 2022-02-24 DIAGNOSIS — O0993 Supervision of high risk pregnancy, unspecified, third trimester: Secondary | ICD-10-CM

## 2022-02-24 DIAGNOSIS — Z131 Encounter for screening for diabetes mellitus: Secondary | ICD-10-CM

## 2022-02-25 LAB — CBC
Hematocrit: 34.3 % (ref 34.0–46.6)
Hemoglobin: 11.7 g/dL (ref 11.1–15.9)
MCH: 31.3 pg (ref 26.6–33.0)
MCHC: 34.1 g/dL (ref 31.5–35.7)
MCV: 92 fL (ref 79–97)
Platelets: 251 10*3/uL (ref 150–450)
RBC: 3.74 x10E6/uL — ABNORMAL LOW (ref 3.77–5.28)
RDW: 12.4 % (ref 11.7–15.4)
WBC: 8.6 10*3/uL (ref 3.4–10.8)

## 2022-02-25 LAB — HIV ANTIBODY (ROUTINE TESTING W REFLEX): HIV Screen 4th Generation wRfx: NONREACTIVE

## 2022-02-25 LAB — GLUCOSE TOLERANCE, 2 HOURS W/ 1HR
Glucose, 1 hour: 164 mg/dL (ref 70–179)
Glucose, 2 hour: 174 mg/dL — ABNORMAL HIGH (ref 70–152)
Glucose, Fasting: 69 mg/dL — ABNORMAL LOW (ref 70–91)

## 2022-02-25 LAB — ANTIBODY SCREEN: Antibody Screen: NEGATIVE

## 2022-02-25 LAB — RPR: RPR Ser Ql: NONREACTIVE

## 2022-02-26 ENCOUNTER — Encounter: Payer: Self-pay | Admitting: Obstetrics & Gynecology

## 2022-02-26 DIAGNOSIS — O365990 Maternal care for other known or suspected poor fetal growth, unspecified trimester, not applicable or unspecified: Secondary | ICD-10-CM | POA: Insufficient documentation

## 2022-03-11 ENCOUNTER — Other Ambulatory Visit: Payer: Self-pay | Admitting: Obstetrics & Gynecology

## 2022-03-11 DIAGNOSIS — O365921 Maternal care for other known or suspected poor fetal growth, second trimester, fetus 1: Secondary | ICD-10-CM

## 2022-03-12 ENCOUNTER — Ambulatory Visit (INDEPENDENT_AMBULATORY_CARE_PROVIDER_SITE_OTHER): Payer: Medicaid Other | Admitting: Obstetrics & Gynecology

## 2022-03-12 ENCOUNTER — Ambulatory Visit (INDEPENDENT_AMBULATORY_CARE_PROVIDER_SITE_OTHER): Payer: Medicaid Other

## 2022-03-12 ENCOUNTER — Encounter: Payer: Self-pay | Admitting: Obstetrics & Gynecology

## 2022-03-12 VITALS — BP 112/72 | HR 99 | Wt 125.0 lb

## 2022-03-12 DIAGNOSIS — Z3A31 31 weeks gestation of pregnancy: Secondary | ICD-10-CM | POA: Diagnosis not present

## 2022-03-12 DIAGNOSIS — O365921 Maternal care for other known or suspected poor fetal growth, second trimester, fetus 1: Secondary | ICD-10-CM | POA: Diagnosis not present

## 2022-03-12 DIAGNOSIS — O365990 Maternal care for other known or suspected poor fetal growth, unspecified trimester, not applicable or unspecified: Secondary | ICD-10-CM

## 2022-03-12 DIAGNOSIS — O0993 Supervision of high risk pregnancy, unspecified, third trimester: Secondary | ICD-10-CM

## 2022-03-12 DIAGNOSIS — Z3402 Encounter for supervision of normal first pregnancy, second trimester: Secondary | ICD-10-CM

## 2022-03-12 LAB — POCT URINALYSIS DIPSTICK OB
Bilirubin, UA: NEGATIVE
Blood, UA: NEGATIVE
Glucose, UA: NEGATIVE
Leukocytes, UA: NEGATIVE
Nitrite, UA: NEGATIVE
POC,PROTEIN,UA: NEGATIVE
Spec Grav, UA: <=1.005 — AB (ref 1.010–1.025)
Urobilinogen, UA: 0.2 E.U./dL
pH, UA: 5 (ref 5.0–8.0)

## 2022-03-12 NOTE — Progress Notes (Signed)
HIGH-RISK PREGNANCY VISIT [name] MRN [id number]  Date of birth: [birth year] Chief Complaint:   Routine Prenatal Visit  History of Present Illness:   Yolanda Myers is a 21 y.o. G48P0000 female at [redacted]w[redacted]d with an Estimated Date of Delivery: 05/12/22 being seen today for ongoing management of a high-risk pregnancy complicated by FGR with normal Dopplers.    Today she reports no complaints. Contractions: Irritability. Vag. Bleeding: None.  Movement: Present. denies leaking of fluid.      02/23/2022   12:13 PM 10/30/2021    3:35 PM 08/15/2019    2:20 PM 03/02/2018    9:48 AM 11/17/2016   12:53 PM  Depression screen PHQ 2/9  Decreased Interest 0 1 3 1 1   Down, Depressed, Hopeless 1 1 1 2 2   PHQ - 2 Score 1 2 4 3 3   Altered sleeping 1 2 0 1 1  Tired, decreased energy 1 1 1 2 3   Change in appetite 0 0 1 0 0  Feeling bad or failure about yourself  1 1 1  0 2  Trouble concentrating 0 0 0 0 1  Moving slowly or fidgety/restless 0 0 0 0 0  Suicidal thoughts 0 0 1 0 1  PHQ-9 Score 4 6 8 6 11   Difficult doing work/chores   Somewhat difficult Somewhat difficult         02/23/2022   12:14 PM 10/30/2021    3:36 PM 08/15/2019    2:21 PM  GAD 7 : Generalized Anxiety Score  Nervous, Anxious, on Edge 1 0 2  Control/stop worrying 0 0 1  Worry too much - different things 0 0 0  Trouble relaxing 1 0 0  Restless 1 0 1  Easily annoyed or irritable 1 2 1   Afraid - awful might happen 0 0 0  Total GAD 7 Score 4 2 5   Anxiety Difficulty   Somewhat difficult     Review of Systems:   Pertinent items are noted in HPI Denies abnormal vaginal discharge w/ itching/odor/irritation, headaches, visual changes, shortness of breath, chest pain, abdominal pain, severe nausea/vomiting, or problems with urination or bowel movements unless otherwise stated above. Pertinent History Reviewed:  Reviewed past medical,surgical, social, obstetrical and family history.  Reviewed problem list,  medications and allergies. Physical Assessment:   Vitals:   03/12/22 1634  BP: 112/72  Pulse: 99  Weight: 125 lb (56.7 kg)  Body mass index is 20.8 kg/m.           Physical Examination:   General appearance: alert, well appearing, and in no distress  Mental status: alert, oriented to person, place, and time  Skin: warm & dry   Extremities: Edema: None    Cardiovascular: normal heart rate noted  Respiratory: normal respiratory effort, no distress  Abdomen: gravid, soft, non-tender  Pelvic: Cervical exam deferred         Fetal Status:     Movement: Present    Fetal Surveillance Testing today: BPP 8/8   Chaperone: N/A    Results for orders placed or performed in visit on 03/12/22 (from the past 24 hour(s))  POC Urinalysis Dipstick OB   Collection Time: 03/12/22  4:25 PM  Result Value Ref Range   Color, UA yellow    Clarity, UA clear    Glucose, UA Negative Negative   Bilirubin, UA neg    Ketones, UA small    Spec Grav, UA <=1.005 (A) 1.010 - 1.025   Blood,  UA neg    pH, UA 5.0 5.0 - 8.0   POC,PROTEIN,UA Negative Negative, Trace, Small (1+), Moderate (2+), Large (3+), 4+   Urobilinogen, UA 0.2 0.2 or 1.0 E.U./dL   Nitrite, UA neg    Leukocytes, UA Negative Negative   Appearance     Odor      Assessment & Plan:  High-risk pregnancy: G1P0000 at [redacted]w[redacted]d with an Estimated Date of Delivery: 05/12/22      ICD-10-CM   1. Supervision of high risk pregnancy in third trimester  O09.93     2. High-risk pregnancy in third trimester  O09.93 POC Urinalysis Dipstick OB    3. Fetal growth restriction antepartum  O36.5990         Meds: No orders of the defined types were placed in this encounter.   Orders:  Orders Placed This Encounter  Procedures   POC Urinalysis Dipstick OB     Labs/procedures today: U/S  Treatment Plan:  begin twice weekly testing next week    Follow-up: No follow-ups on file.   Future Appointments  Date Time Provider Department Center   03/19/2022  3:00 PM [facility] [place] [facility] None  03/19/2022  3:50 PM Myna Hidalgo, DO [facility]  03/26/2022 10:45 AM [facility] - [facility] [place] [facility] None  03/26/2022 11:50 AM Jacklyn Shell, CNM [facility]  04/02/2022  3:00 PM [facility] - [facility] [place] [facility] None  04/02/2022  3:50 PM Myna Hidalgo, DO [facility]  04/09/2022  2:50 PM [facility] NURSE [facility]  04/09/2022  3:10 PM Myna Hidalgo, DO [facility]  04/16/2022  2:15 PM [facility] - [facility] [place] [facility] None  04/16/2022  3:10 PM Myna Hidalgo, DO [facility]  04/23/2022  2:15 PM [facility] - [facility] [place] [facility] None  04/23/2022  3:10 PM Myna Hidalgo, DO [facility]  04/30/2022  2:15 PM [facility] - [facility] [place] [facility] None  04/30/2022  3:10 PM Lazaro Arms, MD [facility]  05/07/2022  1:30 PM [facility] - [facility] [place] [facility] None  05/07/2022  2:30 PM Shaune Westfall, Amaryllis Dyke, MD [facility]    Orders Placed This Encounter  Procedures   POC Urinalysis Dipstick OB   Lazaro Arms  Attending Physician for the Center for [facility] 03/12/2022 5:08 PM

## 2022-03-12 NOTE — Progress Notes (Signed)
[place] 31+2 wks,cephalic,BPP 8/8,RI .71,.65,.72,.65=73%,AFI 19.9%,posterior placenta gr 1

## 2022-03-16 ENCOUNTER — Other Ambulatory Visit: Payer: Medicaid Other

## 2022-03-16 ENCOUNTER — Encounter: Payer: Self-pay | Admitting: Obstetrics & Gynecology

## 2022-03-18 ENCOUNTER — Other Ambulatory Visit: Payer: Self-pay | Admitting: Obstetrics & Gynecology

## 2022-03-18 DIAGNOSIS — O365930 Maternal care for other known or suspected poor fetal growth, third trimester, not applicable or unspecified: Secondary | ICD-10-CM

## 2022-03-19 ENCOUNTER — Encounter: Payer: Self-pay | Admitting: Obstetrics & Gynecology

## 2022-03-19 ENCOUNTER — Ambulatory Visit (INDEPENDENT_AMBULATORY_CARE_PROVIDER_SITE_OTHER): Payer: Medicaid Other

## 2022-03-19 ENCOUNTER — Ambulatory Visit (INDEPENDENT_AMBULATORY_CARE_PROVIDER_SITE_OTHER): Payer: Medicaid Other | Admitting: Obstetrics & Gynecology

## 2022-03-19 VITALS — BP 117/78 | HR 87 | Wt 124.6 lb

## 2022-03-19 DIAGNOSIS — O365930 Maternal care for other known or suspected poor fetal growth, third trimester, not applicable or unspecified: Secondary | ICD-10-CM

## 2022-03-19 DIAGNOSIS — Z3A01 Less than 8 weeks gestation of pregnancy: Secondary | ICD-10-CM

## 2022-03-19 DIAGNOSIS — Z3403 Encounter for supervision of normal first pregnancy, third trimester: Secondary | ICD-10-CM

## 2022-03-19 DIAGNOSIS — O0993 Supervision of high risk pregnancy, unspecified, third trimester: Secondary | ICD-10-CM

## 2022-03-19 DIAGNOSIS — O365990 Maternal care for other known or suspected poor fetal growth, unspecified trimester, not applicable or unspecified: Secondary | ICD-10-CM

## 2022-03-19 NOTE — Progress Notes (Signed)
HIGH-RISK PREGNANCY VISIT [name] MRN [id number]  Date of birth: 22-Oct-2000 Chief Complaint:   Routine Prenatal Visit ([place] today)  History of Present Illness:   Yolanda Myers is a 20 y.o. G62P0000 female at [redacted]w[redacted]d with an Estimated Date of Delivery: 05/12/22 being seen today for ongoing management of a high-risk pregnancy complicated by: Fetal growth restriction    Today she reports no complaints.   Contractions: Irritability. Vag. Bleeding: None.  Movement: Present. denies leaking of fluid.      02/23/2022   12:13 PM 10/30/2021    3:35 PM 08/15/2019    2:20 PM 03/02/2018    9:48 AM 11/17/2016   12:53 PM  Depression screen PHQ 2/9  Decreased Interest 0 1 3 1 1   Down, Depressed, Hopeless 1 1 1 2 2   PHQ - 2 Score 1 2 4 3 3   Altered sleeping 1 2 0 1 1  Tired, decreased energy 1 1 1 2 3   Change in appetite 0 0 1 0 0  Feeling bad or failure about yourself  1 1 1  0 2  Trouble concentrating 0 0 0 0 1  Moving slowly or fidgety/restless 0 0 0 0 0  Suicidal thoughts 0 0 1 0 1  PHQ-9 Score 4 6 8 6 11   Difficult doing work/chores   Somewhat difficult Somewhat difficult      Current Outpatient Medications  Medication Instructions   acetaminophen (TYLENOL) 1,000 mg, Oral, Every 6 hours PRN   Blood Pressure Monitor MISC For regular home bp monitoring during pregnancy   Elastic Bandages & Supports (COMFORT FIT MATERNITY SUPP MED) MISC 1 each, Does not apply, As needed   famotidine (PEPCID) 20 mg, Oral, Daily   Prenatal Vit-Fe Fumarate-FA (PRENATAL VITAMIN) 27-0.8 MG TABS 1 tablet, Oral, Daily     Review of Systems:   Pertinent items are noted in HPI Denies abnormal vaginal discharge w/ itching/odor/irritation, headaches, visual changes, shortness of breath, chest pain, abdominal pain, severe nausea/vomiting, or problems with urination or bowel movements unless otherwise stated above. Pertinent History Reviewed:  Reviewed past medical,surgical, social, obstetrical  and family history.  Reviewed problem list, medications and allergies. Physical Assessment:   Vitals:   03/19/22 1556  BP: 117/78  Pulse: 87  Weight: 124 lb 9.6 oz (56.5 kg)  Body mass index is 20.73 kg/m.           Physical Examination:   General appearance: alert, well appearing, and in no distress  Mental status: normal mood, behavior, speech, dress, motor activity, and thought processes  Skin: warm & dry   Extremities: Edema: None    Cardiovascular: normal heart rate noted  Respiratory: normal respiratory effort, no distress  Abdomen: gravid, soft, non-tender  Pelvic: Cervical exam deferred         Fetal Status:     Movement: Present    Fetal Surveillance Testing today: 32+2 wks,cephalic,BPP 8/8,posterior placenta gr 1,fhr 135 BPM,AFI 21.8 cm,RI .65,.68=68%,EFW 1623 g 7.2%,AC 5.5%   Chaperone: N/A    No results found for this or any previous visit (from the past 24 hour(s)).   Assessment & Plan:  High-risk pregnancy: G1P0000 at [redacted]w[redacted]d with an Estimated Date of Delivery: 05/12/22   1) FGR -reviewed today's testing -start NST along with BPPs now that she is 32wks -reviewed IOL ~38wk pending fetal well being  -pt given info regarding Tdap  Meds: No orders of the defined types were placed in this encounter.   Labs/procedures  today: BPP  Treatment Plan:  as outlined above  Reviewed: Preterm labor symptoms and general obstetric precautions including but not limited to vaginal bleeding, contractions, leaking of fluid and fetal movement were reviewed in detail with the patient.  All questions were answered.   Follow-up: Return for HROB and weekly visits.   Future Appointments  Date Time Provider Department Center  03/23/2022  3:10 PM [facility] NURSE [facility]  03/26/2022 10:45 AM [facility] - [facility] [place] [facility] None  03/26/2022 11:50 AM Cresenzo-Dishmon, Scarlette Calico, CNM [facility]  03/30/2022 10:50 AM [facility] NURSE [facility]  04/02/2022  3:00 PM [facility] -  [facility]N [place] [facility] None  04/02/2022  3:50 PM Myna Hidalgo, DO [facility]  04/06/2022 10:50 AM [facility] NURSE [facility]  04/09/2022  2:50 PM [facility] NURSE [facility]  04/09/2022  3:10 PM Myna Hidalgo, DO [facility]  04/16/2022  2:15 PM [facility] - [facility] [place] [facility] None  04/16/2022  3:10 PM Myna Hidalgo, DO [facility]  04/20/2022  3:10 PM [facility] NURSE [facility]  04/23/2022  2:15 PM [facility] [place] [facility] None  04/23/2022  3:10 PM Myna Hidalgo, DO [facility]  04/27/2022  3:10 PM [facility] NURSE [facility]  04/30/2022  2:15 PM [facility] - [facility] [place] [facility] None  04/30/2022  3:10 PM Lazaro Arms, MD [facility]  05/04/2022  3:10 PM [facility] NURSE [facility]  05/07/2022  1:30 PM [facility] - [facility] [place] [facility] None  05/07/2022  2:30 PM Lazaro Arms, MD [facility]    No orders of the defined types were placed in this encounter.   Myna Hidalgo, DO Attending Obstetrician & Gynecologist, [facility] for [employer], [facility]

## 2022-03-19 NOTE — Progress Notes (Signed)
[place] 32+2 wks,cephalic,BPP 8/8,posterior placenta gr 1,fhr 135 BPM,AFI 21.8 cm,RI .65,.68=68%,EFW 1623 g 7.2%,AC 5.5%

## 2022-03-23 ENCOUNTER — Ambulatory Visit (INDEPENDENT_AMBULATORY_CARE_PROVIDER_SITE_OTHER): Payer: Medicaid Other | Admitting: *Deleted

## 2022-03-23 VITALS — BP 118/68 | HR 80 | Wt 128.0 lb

## 2022-03-23 DIAGNOSIS — O365990 Maternal care for other known or suspected poor fetal growth, unspecified trimester, not applicable or unspecified: Secondary | ICD-10-CM | POA: Diagnosis not present

## 2022-03-23 DIAGNOSIS — Z3403 Encounter for supervision of normal first pregnancy, third trimester: Secondary | ICD-10-CM

## 2022-03-23 DIAGNOSIS — O0993 Supervision of high risk pregnancy, unspecified, third trimester: Secondary | ICD-10-CM | POA: Diagnosis not present

## 2022-03-23 DIAGNOSIS — Z3A32 32 weeks gestation of pregnancy: Secondary | ICD-10-CM

## 2022-03-23 NOTE — Progress Notes (Signed)
   NURSE VISIT- NST  SUBJECTIVE:  Yolanda Myers is a 21 y.o. G1P0000 female at [redacted]w[redacted]d, here for a NST for pregnancy complicated by FGR.  She reports active fetal movement, contractions: none, vaginal bleeding: none, membranes: intact.   OBJECTIVE:  BP 118/68   Pulse 80   Wt 128 lb (58.1 kg)   LMP 08/05/2021   BMI 21.30 kg/m   Appears well, no apparent distress  No results found for this or any previous visit (from the past 24 hour(s)).  NST: FHR baseline 145 bpm, Variability: moderate, Accelerations:present, Decelerations:  Absent= Cat 1/reactive Toco: occasional   ASSESSMENT: G1P0000 at [redacted]w[redacted]d with FGR NST reactive  PLAN: EFM strip reviewed by Philipp Deputy, CNM   Recommendations: keep next appointment as scheduled    Jobe Marker  03/23/2022 3:56 PM

## 2022-03-25 ENCOUNTER — Other Ambulatory Visit: Payer: Self-pay | Admitting: Obstetrics & Gynecology

## 2022-03-25 DIAGNOSIS — O365931 Maternal care for other known or suspected poor fetal growth, third trimester, fetus 1: Secondary | ICD-10-CM

## 2022-03-26 ENCOUNTER — Ambulatory Visit (INDEPENDENT_AMBULATORY_CARE_PROVIDER_SITE_OTHER): Payer: Medicaid Other | Admitting: Advanced Practice Midwife

## 2022-03-26 ENCOUNTER — Encounter: Payer: Self-pay | Admitting: Advanced Practice Midwife

## 2022-03-26 ENCOUNTER — Ambulatory Visit (INDEPENDENT_AMBULATORY_CARE_PROVIDER_SITE_OTHER): Payer: Medicaid Other

## 2022-03-26 VITALS — BP 124/81 | HR 96 | Wt 126.0 lb

## 2022-03-26 DIAGNOSIS — Z3403 Encounter for supervision of normal first pregnancy, third trimester: Secondary | ICD-10-CM

## 2022-03-26 DIAGNOSIS — Z3A33 33 weeks gestation of pregnancy: Secondary | ICD-10-CM

## 2022-03-26 DIAGNOSIS — O365931 Maternal care for other known or suspected poor fetal growth, third trimester, fetus 1: Secondary | ICD-10-CM

## 2022-03-26 MED ORDER — CITRANATAL 90 DHA 90-1 & 300 MG PO MISC: 1.0000 | Freq: Every day | ORAL | 6 refills | Status: AC

## 2022-03-26 NOTE — Progress Notes (Signed)
HIGH-RISK PREGNANCY VISIT [name] MRN [id number]  Date of birth: 07/12/2001 Chief Complaint:   Routine Prenatal Visit (U/S)  History of Present Illness:   Yolanda Myers is a 21 y.o. G82P0000 female at [redacted]w[redacted]d with an Estimated Date of Delivery: 05/12/22 being seen today for ongoing management of a high-risk pregnancy complicated by fetal growth restriction 7%.    Today she reports no complaints. Contractions: Irritability. Vag. Bleeding: None.  Movement: Present. denies leaking of fluid.      02/23/2022   12:13 PM 10/30/2021    3:35 PM 08/15/2019    2:20 PM 03/02/2018    9:48 AM 11/17/2016   12:53 PM  Depression screen PHQ 2/9  Decreased Interest 0 1 3 1 1   Down, Depressed, Hopeless 1 1 1 2 2   PHQ - 2 Score 1 2 4 3 3   Altered sleeping 1 2 0 1 1  Tired, decreased energy 1 1 1 2 3   Change in appetite 0 0 1 0 0  Feeling bad or failure about yourself  1 1 1  0 2  Trouble concentrating 0 0 0 0 1  Moving slowly or fidgety/restless 0 0 0 0 0  Suicidal thoughts 0 0 1 0 1  PHQ-9 Score 4 6 8 6 11   Difficult doing work/chores   Somewhat difficult Somewhat difficult         02/23/2022   12:14 PM 10/30/2021    3:36 PM 08/15/2019    2:21 PM  GAD 7 : Generalized Anxiety Score  Nervous, Anxious, on Edge 1 0 2  Control/stop worrying 0 0 1  Worry too much - different things 0 0 0  Trouble relaxing 1 0 0  Restless 1 0 1  Easily annoyed or irritable 1 2 1   Afraid - awful might happen 0 0 0  Total GAD 7 Score 4 2 5   Anxiety Difficulty   Somewhat difficult     Review of Systems:   Pertinent items are noted in HPI Denies abnormal vaginal discharge w/ itching/odor/irritation, headaches, visual changes, shortness of breath, chest pain, abdominal pain, severe nausea/vomiting, or problems with urination or bowel movements unless otherwise stated above. Pertinent History Reviewed:  Reviewed past medical,surgical, social, obstetrical and family history.  Reviewed problem  list, medications and allergies. Physical Assessment:   Vitals:   03/26/22 1130  BP: 124/81  Pulse: 96  Weight: 126 lb (57.2 kg)  Body mass index is 20.97 kg/m.           Physical Examination:   General appearance: alert, well appearing, and in no distress  Mental status: alert, oriented to person, place, and time  Skin: warm & dry   Extremities: Edema: None    Cardiovascular: normal heart rate noted  Respiratory: normal respiratory effort, no distress  Abdomen: gravid, soft, non-tender  Pelvic: Cervical exam deferred         Fetal Status:     Movement: Present    Fetal Surveillance Testing today: 33+2 wks,cephalic,BPP 8/8,FHR 169 bpm,posterior placenta gr 2,AFI 20 cm,RI .60,.64,.59,.52=38%   Chaperone: N/A    No results found for this or any previous visit (from the past 24 hour(s)).  Assessment & Plan:  High-risk pregnancy: G1P0000 at [redacted]w[redacted]d with an Estimated Date of Delivery: 05/12/22    No diagnosis found.    Meds:  Meds ordered this encounter  Medications   Prenat w/o A-FeCbGl-DSS-FA-DHA (CITRANATAL 90 DHA) 90-1 & 300 MG MISC    Sig:  Take 1 tablet by mouth daily.    Dispense:  30 each    Refill:  6    Order Specific Question:   Supervising Provider    Answer:   Duane Lope H [[id number]]    Orders: No orders of the defined types were placed in this encounter.    Labs/procedures today: BPP/dopplers   Reviewed: Preterm labor symptoms and general obstetric precautions including but not limited to vaginal bleeding, contractions, leaking of fluid and fetal movement were reviewed in detail with the patient.  All questions were answered. Does have home bp cuff. Office bp cuff given: not applicable. Check bp weekly, let us know if consistently >140 and/or >90.  Follow-up:    Future Appointments  Date Time Provider Department Center  03/30/2022 10:50 AM [facility] NURSE [facility]  04/02/2022  3:00 PM [facility] - [facility] [place] [facility] None  04/02/2022  3:50 PM Myna Hidalgo, DO [facility]  04/06/2022 10:50 AM [facility] NURSE [facility]  04/09/2022  2:50 PM [facility] NURSE [facility]  04/09/2022  3:10 PM Myna Hidalgo, DO [facility]  04/16/2022  2:15 PM [facility] - [facility] [place] [facility] None  04/16/2022  3:10 PM Myna Hidalgo, DO [facility]  04/20/2022  2:30 PM [facility] NURSE [facility]  04/23/2022  2:15 PM [facility] [place] [facility] None  04/23/2022  3:10 PM Myna Hidalgo, DO [facility]  04/27/2022  2:50 PM [facility] NURSE [facility]  04/30/2022  2:15 PM [facility] [place] [facility] None  04/30/2022  3:10 PM Lazaro Arms, MD [facility]  05/04/2022  2:50 PM [facility] NURSE [facility]  05/07/2022  1:30 PM [facility] - [facility] [place] [facility] None  05/07/2022  2:30 PM Lazaro Arms, MD [facility]    No orders of the defined types were placed in this encounter.  Jacklyn Shell , DNP, CNM [facility] 03/26/2022 1:18 PM

## 2022-03-26 NOTE — Patient Instructions (Signed)
[phone] is the phone number for Pregnancy Classes or hospital tours at [facility].   You will be referred to  [email or web address]   for more information on childbirth classes   At this site you may register for classes. You may sign up for a waiting list if classes are full. Please SIGN UP FOR THIS!.   When the waiting list becomes long, sometimes new classes can be added.  [facility] [place] Call to Register: [phone] or [phone]   or   Register Online: [email or web address] THESE CLASSES FILL UP VERY QUICKLY, SO SIGN UP AS SOON AS YOU CAN!!! Please visit [facility] pregnancy website at [email or web address]  Childbirth Classes  Option 1: Birth & Baby Series ? Series of 3 weekly classes, on the same day of the week (can choose Mon-Thurs) from 6-9pm ? Helps you and your support person prepare for childbirth ? Reviews newborn care, labor & birth, cesarean birth, pain management, and comfort techniques ? Cost: $60 per couple for insured or self-pay, $30 per couple for Medicaid  Option 2: Weekend Birth & Baby ? This class is a weekend version of our Birth & Baby series.  It is designed for parents who have a difficult time fitting several weeks of classes into their schedule.   ? Covers the care of your newborn and the basics of labor and childbirth ? Friday 6:30pm-8:30pm Saturday 9am-4pm, includes lunch for you and your partner  ? Cost: $75 per couple for insured or self-pay, $30 per couple for Medicaid  Option 3: Natural Childbirth ? This series of 5 weekly classes is for expectant parents who want to learn and practice natural methods of coping with the process of labor and childbirth.  Can choose Mon or Tues, 7-9pm.   ? Covers relaxation, breathing, massage, visualization, role of the partner, and helpful positioning ? Participants learn how to be confident  in their body's ability to give birth. Class empowers and helps parents make informed decisions about care. Includes discussion that will help new parents transition into the immediate postpartum period.  ? Cost: $75 per couple for insured or self-pay, $30 per couple for Medicaid  Option 4: Online Birth & Baby ? This online class offers you the freedom to complete a Birth & Baby series in the comfort of your own home.  The flexibility of this option allows you to review sections at your own pace, at times convenient to you and your support people.  It includes additional video information, animations, quizzes and extended activities. Get organized with helpful eClass tools, checklists, and trackers.  ? Cost: $60 for 60 days of online access                                                                            Other Available Classes  Baby & Me Enjoy this time to discuss newborn & infant parenting topics and family adjustment issues with other new mothers in a relaxed environment. Each week brings a new speaker or baby-centered activity. We encourage mothers and their babies (birth to crawling) to join us. You are welcome to visit this group even if you haven't delivered yet! It's wonderful to make new friends early   and watch other moms interact with their babies. No registration or fee.  Big Brother/Big Sister Let your children share in the joy of a new brother or sister in this special class designed just for them. Discussion includes how families care for babies: swaddling, holding, diapering, safety, as well as how they can be helpful in their new role. This class is designed for children ages 2 to 6, but any age is welcome. Please register each child individually. $5 Breastfeeding Support Group This group is a mother-to-mother support circle where moms have the opportunity to share their breastfeeding experiences. A Breastfeeding Support nurse is present for questions and concerns. An infant  scale is available for weight checks. No fee or registration.  Breastfeeding Your Baby Breastfeeding is a special time for mother and child. This class will help you feel ready to begin this important relationship. Your partner is encouraged to attend with you. Learn what to expect and feel more confident in the first days of breastfeeding your newborn. This class also addresses the most common fears and challenges of breastfeeding during the first few weeks, months, and beyond. $30 per couple Caring for Baby This class is for expectant and adoptive parents who want to learn and practice the most up-to-date newborn care for their babies. Focus is on birth through first six weeks of life. Topics include feeding, bathing, diapering, crying, umbilical cord care, circumcision care and safe sleep. Parents learn how to recognize symptoms of illness and when to call the pediatrician. Register only the mom-to-be and your partner can plan to come with you. (*Note: This class is included in the Birth & Baby series and the Weekend Birth & Baby classes.) $10 per couple Comfort Techniques & Tour This 2-hour interactive class is designed for those who either do not wish to take the Birth & Baby series or for those who prefer our online childbirth class, but don't want to miss the opportunity to learn and practice hands-on techniques. These skills can help relieve some of the discomfort of labor and encourage your baby to rotate toward the best position for birth. You and your partner will be able to try a variety of labor positions with birth balls and rebozos as well as practice breathing, relaxation, and visual techniques. $20 per couple Daddy Boot Camp This course offers Dads-to-be the tools and knowledge needed to feel confident on their journey to becoming new fathers. Experienced dads, who have been trained as coaches, teach dads-to-be how to hold, comfort, diapers, swaddle and play with their infant while being  able to support the new mom as well. $25 Grandparent Love Expecting a grandbaby? Learn about the latest infant care and safety recommendations and ways to support your own child as he or she transitions into the parenting role. $10 per person Infant and Child CPR Parents, grandparents, babysitters, and friends learn Cardio-Pulmonary Resuscitation skills for infants and children. You will also learn how to treat both conscious and unconscious choking infants and children. Register each participant individually. (Note: This Family & Friends program does not offer certification.) $20 per person Marvelous Multiples Expecting twins, triplets, or more? This free 2-hour class covers the differences in labor, birth, parenting, and breastfeeding issues that face multiples' parents.  Maternity Care Center Virtual Tour  Online virtual tour of the [facility] [place]  Mom Talk This free mom-led group offers support and connection to mothers as they journey through the adjustments and struggles of that   sometimes overwhelming first year after the birth of a child. A member of our staff will be present to share resources and additional support if needed, as you care for yourself and baby. You are welcome to visit this group before you deliver! It's wonderful to meet new friends early and watch other moms interact with their babies.  Waterbirth Class Interested in a waterbirth? This free informational class will help you discover whether waterbirth is the right fit for you and is required if you are planning a waterbirth. Education about waterbirth itself, supplies you may need, and what you may need from your support team is included in this class. Partners are encouraged to come.    

## 2022-03-26 NOTE — Progress Notes (Signed)
[place] 33+2 wks,cephalic,BPP 8/8,FHR 169 bpm,posterior placenta gr 2,AFI 20 cm,RI .60,.64,.59,.52=38%

## 2022-03-30 ENCOUNTER — Ambulatory Visit (INDEPENDENT_AMBULATORY_CARE_PROVIDER_SITE_OTHER): Payer: Medicaid Other | Admitting: *Deleted

## 2022-03-30 DIAGNOSIS — O365990 Maternal care for other known or suspected poor fetal growth, unspecified trimester, not applicable or unspecified: Secondary | ICD-10-CM | POA: Diagnosis not present

## 2022-03-30 DIAGNOSIS — O0993 Supervision of high risk pregnancy, unspecified, third trimester: Secondary | ICD-10-CM

## 2022-03-30 DIAGNOSIS — Z3A33 33 weeks gestation of pregnancy: Secondary | ICD-10-CM

## 2022-03-30 NOTE — Progress Notes (Signed)
   NURSE VISIT- NST  SUBJECTIVE:  Yolanda Myers is a 21 y.o. G1P0000 female at [redacted]w[redacted]d, here for a NST for pregnancy complicated by FGR.  She reports active fetal movement, contractions: none, vaginal bleeding: none, membranes: intact.   OBJECTIVE:  BP 109/73   Wt 127 lb 4.8 oz (57.7 kg)   LMP 08/05/2021   BMI 21.18 kg/m   Appears well, no apparent distress  No results found for this or any previous visit (from the past 24 hour(s)).  NST: FHR baseline 125 bpm, Variability: moderate, Accelerations:present, Decelerations:  Absent= Cat 1/reactive Toco: UI   ASSESSMENT: G1P0000 at [redacted]w[redacted]d with FGR NST reactive  PLAN: EFM strip reviewed by Joellyn Haff, CNM, [facility]   Recommendations: keep next appointment as scheduled    Jobe Marker  03/30/2022 12:28 PM

## 2022-04-01 ENCOUNTER — Other Ambulatory Visit: Payer: Self-pay | Admitting: Obstetrics & Gynecology

## 2022-04-01 DIAGNOSIS — O365930 Maternal care for other known or suspected poor fetal growth, third trimester, not applicable or unspecified: Secondary | ICD-10-CM

## 2022-04-02 ENCOUNTER — Ambulatory Visit (INDEPENDENT_AMBULATORY_CARE_PROVIDER_SITE_OTHER): Payer: Medicaid Other

## 2022-04-02 ENCOUNTER — Ambulatory Visit (INDEPENDENT_AMBULATORY_CARE_PROVIDER_SITE_OTHER): Payer: Medicaid Other | Admitting: Obstetrics & Gynecology

## 2022-04-02 ENCOUNTER — Encounter: Payer: Self-pay | Admitting: Obstetrics & Gynecology

## 2022-04-02 VITALS — BP 107/71 | HR 82 | Wt 128.0 lb

## 2022-04-02 DIAGNOSIS — O365930 Maternal care for other known or suspected poor fetal growth, third trimester, not applicable or unspecified: Secondary | ICD-10-CM

## 2022-04-02 DIAGNOSIS — O365990 Maternal care for other known or suspected poor fetal growth, unspecified trimester, not applicable or unspecified: Secondary | ICD-10-CM

## 2022-04-02 DIAGNOSIS — Z3A34 34 weeks gestation of pregnancy: Secondary | ICD-10-CM | POA: Diagnosis not present

## 2022-04-02 DIAGNOSIS — O0993 Supervision of high risk pregnancy, unspecified, third trimester: Secondary | ICD-10-CM

## 2022-04-02 NOTE — Progress Notes (Signed)
[place] 34+2 wks,cephalic,FHR 124 bpm,posterior placenta gr 3,AFI 18 cm,RI .67,.58,.60=48%,BPP 8/8

## 2022-04-02 NOTE — Progress Notes (Signed)
HIGH-RISK PREGNANCY VISIT [name] MRN [id number]  Date of birth: 2000/10/19 Chief Complaint:   Routine Prenatal Visit  History of Present Illness:   Yolanda Myers is a 21 y.o. G44P0000 female at [redacted]w[redacted]d with an Estimated Date of Delivery: 05/12/22 being seen today for ongoing management of a high-risk pregnancy complicated by:  -FGR.    Today she reports  occasional pelvic pressure .   Contractions: Not present. Vag. Bleeding: None.  Movement: Present. denies leaking of fluid.      02/23/2022   12:13 PM 10/30/2021    3:35 PM 08/15/2019    2:20 PM 03/02/2018    9:48 AM 11/17/2016   12:53 PM  Depression screen PHQ 2/9  Decreased Interest 0 1 3 1 1   Down, Depressed, Hopeless 1 1 1 2 2   PHQ - 2 Score 1 2 4 3 3   Altered sleeping 1 2 0 1 1  Tired, decreased energy 1 1 1 2 3   Change in appetite 0 0 1 0 0  Feeling bad or failure about yourself  1 1 1  0 2  Trouble concentrating 0 0 0 0 1  Moving slowly or fidgety/restless 0 0 0 0 0  Suicidal thoughts 0 0 1 0 1  PHQ-9 Score 4 6 8 6 11   Difficult doing work/chores   Somewhat difficult Somewhat difficult      Current Outpatient Medications  Medication Instructions   acetaminophen (TYLENOL) 1,000 mg, Oral, Every 6 hours PRN   Blood Pressure Monitor MISC For regular home bp monitoring during pregnancy   Elastic Bandages & Supports (COMFORT FIT MATERNITY SUPP MED) MISC 1 each, Does not apply, As needed   famotidine (PEPCID) 20 mg, Oral, Daily   Prenat w/o A-FeCbGl-DSS-FA-DHA (CITRANATAL 90 DHA) 90-1 & 300 MG MISC 1 tablet, Oral, Daily   Prenatal Vit-Fe Fumarate-FA (PRENATAL VITAMIN) 27-0.8 MG TABS 1 tablet, Oral, Daily     Review of Systems:   Pertinent items are noted in HPI Denies abnormal vaginal discharge w/ itching/odor/irritation, headaches, visual changes, shortness of breath, chest pain, abdominal pain, severe nausea/vomiting, or problems with urination or bowel movements unless otherwise stated  above. Pertinent History Reviewed:  Reviewed past medical,surgical, social, obstetrical and family history.  Reviewed problem list, medications and allergies. Physical Assessment:   Vitals:   04/02/22 1547  BP: 107/71  Pulse: 82  Weight: 128 lb (58.1 kg)  Body mass index is 21.3 kg/m.           Physical Examination:   General appearance: alert, well appearing, and in no distress  Mental status: normal mood, behavior, speech, dress, motor activity, and thought processes  Skin: warm & dry   Extremities: Edema: None    Cardiovascular: normal heart rate noted  Respiratory: normal respiratory effort, no distress  Abdomen: gravid, soft, non-tender  Pelvic: Cervical exam deferred         Fetal Status:     Movement: Present    Fetal Surveillance Testing today: 34+2 wks,cephalic,FHR 124 bpm,posterior placenta gr 3,AFI 18 cm,RI .67,.58,.60=48%,BPP 8/8   Chaperone: N/A    No results found for this or any previous visit (from the past 24 hour(s)).   Assessment & Plan:  High-risk pregnancy: G1P0000 at [redacted]w[redacted]d with an Estimated Date of Delivery: 05/12/22   1) FGR -BPP 8/8, normal dopplers -continue weekly testing and serial growth scan -IOL per MFM algorithm, as of now plan for IOL @ 38wk  []  next visit- GBS, GC/C  Meds: No orders of the defined types were placed in this encounter.   Labs/procedures today: BPP  Treatment Plan:  as outlined above  Reviewed: Preterm labor symptoms and general obstetric precautions including but not limited to vaginal bleeding, contractions, leaking of fluid and fetal movement were reviewed in detail with the patient.  All questions were answered. Pt has home bp cuff. Check bp weekly, let us know if >140/90.   Follow-up: Return in about 1 week (around 04/09/2022) for HROB visit- weekly as scheduled.   Future Appointments  Date Time Provider Department Center  04/06/2022 10:50 AM [facility] NURSE [facility]  04/09/2022  2:50 PM [facility]  NURSE [facility]  04/09/2022  3:10 PM Myna Hidalgo, DO [facility]  04/16/2022  2:15 PM [facility] [place] [facility] None  04/16/2022  3:10 PM Myna Hidalgo, DO [facility]  04/20/2022  2:30 PM [facility] NURSE [facility]  04/23/2022  2:15 PM [facility] [place] [facility] None  04/23/2022  3:10 PM Myna Hidalgo, DO [facility]  04/27/2022  2:50 PM [facility] NURSE [facility]  04/30/2022  2:15 PM [facility] [place] [facility] None  04/30/2022  3:10 PM Lazaro Arms, MD [facility]  05/04/2022  2:50 PM [facility] NURSE [facility]  05/07/2022  1:30 PM [facility] [place] [facility] None  05/07/2022  2:30 PM Lazaro Arms, MD [facility]    No orders of the defined types were placed in this encounter.   Myna Hidalgo, DO Attending Obstetrician & Gynecologist, [facility] for [employer], [facility]

## 2022-04-04 ENCOUNTER — Inpatient Hospital Stay (HOSPITAL_COMMUNITY)
Admission: AD | Admit: 2022-04-04 | Discharge: 2022-04-04 | Disposition: A | Discharge status: Home / Self Care | Payer: Medicaid Other | Attending: Obstetrics & Gynecology | Admitting: Obstetrics & Gynecology

## 2022-04-04 ENCOUNTER — Encounter (HOSPITAL_COMMUNITY): Payer: Self-pay | Admitting: Obstetrics & Gynecology

## 2022-04-04 DIAGNOSIS — N898 Other specified noninflammatory disorders of vagina: Secondary | ICD-10-CM | POA: Diagnosis not present

## 2022-04-04 DIAGNOSIS — Z3A34 34 weeks gestation of pregnancy: Secondary | ICD-10-CM

## 2022-04-04 DIAGNOSIS — Z0371 Encounter for suspected problem with amniotic cavity and membrane ruled out: Secondary | ICD-10-CM

## 2022-04-04 DIAGNOSIS — O26893 Other specified pregnancy related conditions, third trimester: Secondary | ICD-10-CM | POA: Diagnosis not present

## 2022-04-04 LAB — WET PREP, GENITAL
Sperm: NONE SEEN
Trich, Wet Prep: NONE SEEN
WBC, Wet Prep HPF POC: >=10 — AB (ref <10)

## 2022-04-04 MED ORDER — TERCONAZOLE 0.4 % VA CREA
1.0000 | TOPICAL_CREAM | Freq: Every day | VAGINAL | 0 refills | Status: DC
Start: 2022-04-04 — End: 2022-05-03

## 2022-04-04 NOTE — MAU Provider Note (Signed)
Event Date/Time  First Provider Initiated Contact with Patient 04/04/22 1734     S: Ms. Yolanda Myers is a 21 y.o. G1P0000 at [redacted]w[redacted]d  who presents to MAU today complaining of leaking of fluid since a few weeks ago. The discharge is yellow in color. Recent intercourse. She denies vaginal bleeding. She denies contractions. She reports normal fetal movement.    O: BP 112/78 (BP Location: Right Arm)   Pulse 97   Temp 98.1 F (36.7 C) (Oral)   Resp 17   Ht 5\' 5"  (1.651 m)   Wt 57.6 kg   LMP 08/05/2021   SpO2 97%   BMI 21.12 kg/m  GENERAL: Well-developed, well-nourished female in no acute distress.  HEAD: Normocephalic, atraumatic.  CHEST: Normal effort of breathing, regular heart rate ABDOMEN: Soft, nontender, gravid PELVIC: Normal external female genitalia. Vagina is pink and rugated. Cervix with normal contour, no lesions. Normal discharge.  Negative pooling. Large amount of thick, curd like, white discharge.   Cervical exam:  Deferred  Fetal Monitoring: Baseline: 130 bpm Variability: Moderate  Accelerations: 15x15 Decelerations: None Contractions: None  Results for orders placed or performed during the hospital encounter of 04/04/22 (from the past 24 hour(s))  Wet prep, genital     Status: Abnormal   Collection Time: 04/04/22  5:32 PM   Specimen: PATH Cytology Cervicovaginal Ancillary Only  Result Value Ref Range   Yeast Wet Prep HPF POC PRESENT (A) NONE SEEN   Trich, Wet Prep NONE SEEN NONE SEEN   Clue Cells Wet Prep HPF POC PRESENT (A) NONE SEEN   WBC, Wet Prep HPF POC >=10 (A) <10   Sperm NONE SEEN      A: SIUP at [redacted]w[redacted]d  Membranes intact  1. Encounter for suspected PROM, with rupture of membranes not found   2. Vaginal discharge in pregnancy in third trimester   3. [redacted] weeks gestation of pregnancy      P:  Dc home.  Return to MAU if symptoms worsen Rx: Terazol Follow up with OB as scheduled.   [redacted]w[redacted]d, NP 04/04/2022 6:10 PM

## 2022-04-04 NOTE — MAU Note (Signed)
...  Yolanda Myers is a 21 y.o. at [redacted]w[redacted]d here in MAU reporting: Yellow vaginal discharge that began last night that does not have an odor and is thick. She reports for the past three weeks she has been experiencing occasional trickling of fluids after urination. She reports she will stand and feel fluids leak out. She reports she also feels it after having intercourse. Denies VB. Last IC yesterday. Unsure if it is related to the yellow discharge. +FM.  Pain score: Denies pain.  FHT: 135 initial external Lab orders placed from triage: Crist Fat, G/C, and Wet Prep.

## 2022-04-06 ENCOUNTER — Ambulatory Visit (INDEPENDENT_AMBULATORY_CARE_PROVIDER_SITE_OTHER): Payer: Medicaid Other | Admitting: *Deleted

## 2022-04-06 ENCOUNTER — Telehealth: Payer: Self-pay | Admitting: *Deleted

## 2022-04-06 DIAGNOSIS — Z3A34 34 weeks gestation of pregnancy: Secondary | ICD-10-CM | POA: Diagnosis not present

## 2022-04-06 DIAGNOSIS — O365990 Maternal care for other known or suspected poor fetal growth, unspecified trimester, not applicable or unspecified: Secondary | ICD-10-CM

## 2022-04-06 DIAGNOSIS — O0993 Supervision of high risk pregnancy, unspecified, third trimester: Secondary | ICD-10-CM | POA: Diagnosis not present

## 2022-04-06 LAB — GC/CHLAMYDIA PROBE AMP (~~LOC~~) NOT AT ARMC
Chlamydia: POSITIVE — AB
Comment: NEGATIVE
Comment: NORMAL
Neisseria Gonorrhea: NEGATIVE

## 2022-04-06 NOTE — Telephone Encounter (Signed)
Received message from April @ [facility] regarding Rx for Terconazole. She stated that the medication is not covered by pt's Medicaid plan and in fact there are no medications for vaginal yeast covered by her insurance.

## 2022-04-06 NOTE — Progress Notes (Signed)
   NURSE VISIT- NST  SUBJECTIVE:  Yolanda Myers is a 21 y.o. G1P0000 female at [redacted]w[redacted]d, here for a NST for pregnancy complicated by FGR.  She reports active fetal movement, contractions: none, vaginal bleeding: none, membranes: intact.   OBJECTIVE:  BP 110/73   Pulse 81   Wt 128 lb (58.1 kg)   LMP 08/05/2021   BMI 21.30 kg/m   Appears well, no apparent distress  No results found for this or any previous visit (from the past 24 hour(s)).  NST: FHR baseline 115 bpm, Variability: moderate, Accelerations:present, Decelerations:  Absent= Cat 1/reactive Toco: none   ASSESSMENT: G1P0000 at [redacted]w[redacted]d with FGR NST reactive  PLAN: EFM strip reviewed by Dr. Charlotta Newton   Recommendations: keep next appointment as scheduled    Jobe Marker  04/06/2022 12:25 PM

## 2022-04-07 ENCOUNTER — Telehealth: Payer: Self-pay | Admitting: Obstetrics & Gynecology

## 2022-04-07 ENCOUNTER — Encounter: Payer: Self-pay | Admitting: *Deleted

## 2022-04-07 NOTE — Telephone Encounter (Signed)
Spoke with patient and advised using otc monistat 7 day treatment.

## 2022-04-07 NOTE — Telephone Encounter (Signed)
Cream that was called in by Milas Hock is not covered by her insurance. Please advise.

## 2022-04-07 NOTE — Telephone Encounter (Signed)
Called patient back, no answer. Sent [employer].

## 2022-04-09 ENCOUNTER — Ambulatory Visit (INDEPENDENT_AMBULATORY_CARE_PROVIDER_SITE_OTHER): Payer: Medicaid Other | Admitting: Obstetrics & Gynecology

## 2022-04-09 ENCOUNTER — Other Ambulatory Visit: Payer: Medicaid Other

## 2022-04-09 ENCOUNTER — Encounter: Payer: Self-pay | Admitting: Obstetrics & Gynecology

## 2022-04-09 VITALS — BP 127/86 | HR 104 | Wt 128.0 lb

## 2022-04-09 DIAGNOSIS — O0993 Supervision of high risk pregnancy, unspecified, third trimester: Secondary | ICD-10-CM

## 2022-04-09 DIAGNOSIS — O365990 Maternal care for other known or suspected poor fetal growth, unspecified trimester, not applicable or unspecified: Secondary | ICD-10-CM

## 2022-04-09 DIAGNOSIS — A749 Chlamydial infection, unspecified: Secondary | ICD-10-CM

## 2022-04-09 MED ORDER — AZITHROMYCIN 500 MG PO TABS: 1000.0000 mg | ORAL_TABLET | Freq: Every day | ORAL | 1 refills | Status: AC

## 2022-04-09 NOTE — Progress Notes (Signed)
HIGH-RISK PREGNANCY VISIT [name] MRN [id number]  Date of birth: 2001/08/08 Chief Complaint:   Routine Prenatal Visit and Non-stress Test  History of Present Illness:   Yolanda Myers is a 21 y.o. G25P0000 female at [redacted]w[redacted]d with an Estimated Date of Delivery: 05/12/22 being seen today for ongoing management of a high-risk pregnancy complicated by:  -FGR -Recent Chlamydia.    Today she reports  vaginal discharge .   Contractions: Not present. Vag. Bleeding: None.  Movement: Present. denies leaking of fluid.      02/23/2022   12:13 PM 10/30/2021    3:35 PM 08/15/2019    2:20 PM 03/02/2018    9:48 AM 11/17/2016   12:53 PM  Depression screen PHQ 2/9  Decreased Interest 0 1 3 1 1   Down, Depressed, Hopeless 1 1 1 2 2   PHQ - 2 Score 1 2 4 3 3   Altered sleeping 1 2 0 1 1  Tired, decreased energy 1 1 1 2 3   Change in appetite 0 0 1 0 0  Feeling bad or failure about yourself  1 1 1  0 2  Trouble concentrating 0 0 0 0 1  Moving slowly or fidgety/restless 0 0 0 0 0  Suicidal thoughts 0 0 1 0 1  PHQ-9 Score 4 6 8 6 11   Difficult doing work/chores   Somewhat difficult Somewhat difficult      Current Outpatient Medications  Medication Instructions   azithromycin (ZITHROMAX) 1,000 mg, Oral, Daily   Prenat w/o A-FeCbGl-DSS-FA-DHA (CITRANATAL 90 DHA) 90-1 & 300 MG MISC 1 tablet, Oral, Daily   Prenatal Vit-Fe Fumarate-FA (PRENATAL VITAMIN) 27-0.8 MG TABS 1 tablet, Oral, Daily   terconazole (TERAZOL 7) 0.4 % vaginal cream 1 applicator, Vaginal, Daily at bedtime     Review of Systems:   Pertinent items are noted in HPI Denies headaches, visual changes, shortness of breath, chest pain, abdominal pain, severe nausea/vomiting, or problems with urination or bowel movements unless otherwise stated above. Pertinent History Reviewed:  Reviewed past medical,surgical, social, obstetrical and family history.  Reviewed problem list, medications and allergies. Physical  Assessment:   Vitals:   04/09/22 1504  BP: 127/86  Pulse: (!) 104  Weight: 128 lb (58.1 kg)  Body mass index is 21.3 kg/m.           Physical Examination:   General appearance: alert, well appearing, and in no distress  Mental status: normal mood, behavior, speech, dress, motor activity, and thought processes  Skin: warm & dry   Extremities: Edema: None    Cardiovascular: normal heart rate noted  Respiratory: normal respiratory effort, no distress  Abdomen: gravid, soft, non-tender  Pelvic: Cervical exam deferred         Fetal Status:     Movement: Present    Fetal Surveillance Testing today: NST  NST being performed due to FGR   Fetal Monitoring:  Baseline: 120 bpm, Variability: moderate, Accelerations: present, The accelerations are >15 bpm and more than 2 in 20 minutes, and Decelerations: Absent     Final diagnosis:   Reactive NST      Chaperone: N/A    No results found for this or any previous visit (from the past 24 hour(s)).   Assessment & Plan:  High-risk pregnancy: G1P0000 at [redacted]w[redacted]d with an Estimated Date of Delivery: 05/12/22   1) FGR -reactive NST today, continue twice weekly testing -reviewed early IOL 38wk pending FWB/dopplers  2) Chlamydia -reviewed results, partner to also be  treated -TOC s/p treatment   Meds:  Meds ordered this encounter  Medications   azithromycin (ZITHROMAX) 500 MG tablet    Sig: Take 2 tablets (1,000 mg total) by mouth daily for 1 day.    Dispense:  2 tablet    Refill:  1    Labs/procedures today: NST, GBS- next visit  Treatment Plan:  as outlined above  Reviewed: Preterm labor symptoms and general obstetric precautions including but not limited to vaginal bleeding, contractions, leaking of fluid and fetal movement were reviewed in detail with the patient.  All questions were answered. Pt has home bp cuff. Check bp weekly, let us know if >140/90.   Follow-up: Return for twice weekly as scheduled.   Future  Appointments  Date Time Provider Department Center  04/16/2022  2:15 PM [facility] - [facility] [place] [facility] None  04/16/2022  3:10 PM Myna Hidalgo, DO [facility]  04/20/2022  2:30 PM [facility] NURSE [facility]  04/23/2022  1:30 PM [facility] - [facility] [place] [facility] None  04/23/2022  3:10 PM Myna Hidalgo, DO [facility]  04/27/2022  2:50 PM [facility] NURSE [facility]  04/30/2022  2:15 PM [facility] - [facility] [place] [facility] None  04/30/2022  3:10 PM Lazaro Arms, MD [facility]  05/04/2022  2:50 PM [facility] NURSE [facility]  05/07/2022  1:30 PM [facility] - [facility] [place] [facility] None  05/07/2022  2:30 PM Lazaro Arms, MD [facility]    No orders of the defined types were placed in this encounter.   Myna Hidalgo, DO Attending Obstetrician & Gynecologist, [facility] for [employer], [facility]

## 2022-04-15 ENCOUNTER — Other Ambulatory Visit: Payer: Self-pay | Admitting: Obstetrics & Gynecology

## 2022-04-15 DIAGNOSIS — O365931 Maternal care for other known or suspected poor fetal growth, third trimester, fetus 1: Secondary | ICD-10-CM

## 2022-04-16 ENCOUNTER — Ambulatory Visit (INDEPENDENT_AMBULATORY_CARE_PROVIDER_SITE_OTHER): Payer: Medicaid Other

## 2022-04-16 ENCOUNTER — Ambulatory Visit (INDEPENDENT_AMBULATORY_CARE_PROVIDER_SITE_OTHER): Payer: Medicaid Other | Admitting: Obstetrics & Gynecology

## 2022-04-16 ENCOUNTER — Encounter: Payer: Self-pay | Admitting: Obstetrics & Gynecology

## 2022-04-16 VITALS — BP 119/74 | HR 78 | Wt 128.8 lb

## 2022-04-16 DIAGNOSIS — Z3A36 36 weeks gestation of pregnancy: Secondary | ICD-10-CM

## 2022-04-16 DIAGNOSIS — O365931 Maternal care for other known or suspected poor fetal growth, third trimester, fetus 1: Secondary | ICD-10-CM

## 2022-04-16 DIAGNOSIS — O0993 Supervision of high risk pregnancy, unspecified, third trimester: Secondary | ICD-10-CM

## 2022-04-16 NOTE — Progress Notes (Signed)
HIGH-RISK PREGNANCY VISIT [name] MRN [id number]  Date of birth: 2001/01/19 Chief Complaint:   Routine Prenatal Visit  History of Present Illness:   Yolanda Myers is a 21 y.o. G50P0000 female at [redacted]w[redacted]d with an Estimated Date of Delivery: 05/12/22 being seen today for ongoing management of a high-risk pregnancy complicated by:  Fetal growth restriction.    Today she reports no complaints.   Contractions: Not present. Vag. Bleeding: None.  Movement: Present. denies leaking of fluid.      02/23/2022   12:13 PM 10/30/2021    3:35 PM 08/15/2019    2:20 PM 03/02/2018    9:48 AM 11/17/2016   12:53 PM  Depression screen PHQ 2/9  Decreased Interest 0 1 3 1 1   Down, Depressed, Hopeless 1 1 1 2 2   PHQ - 2 Score 1 2 4 3 3   Altered sleeping 1 2 0 1 1  Tired, decreased energy 1 1 1 2 3   Change in appetite 0 0 1 0 0  Feeling bad or failure about yourself  1 1 1  0 2  Trouble concentrating 0 0 0 0 1  Moving slowly or fidgety/restless 0 0 0 0 0  Suicidal thoughts 0 0 1 0 1  PHQ-9 Score 4 6 8 6 11   Difficult doing work/chores   Somewhat difficult Somewhat difficult      Current Outpatient Medications  Medication Instructions   Prenat w/o A-FeCbGl-DSS-FA-DHA (CITRANATAL 90 DHA) 90-1 & 300 MG MISC 1 tablet, Oral, Daily   Prenatal Vit-Fe Fumarate-FA (PRENATAL VITAMIN) 27-0.8 MG TABS 1 tablet, Oral, Daily   terconazole (TERAZOL 7) 0.4 % vaginal cream 1 applicator, Vaginal, Daily at bedtime     Review of Systems:   Pertinent items are noted in HPI Denies abnormal vaginal discharge w/ itching/odor/irritation, headaches, visual changes, shortness of breath, chest pain, abdominal pain, severe nausea/vomiting, or problems with urination or bowel movements unless otherwise stated above. Pertinent History Reviewed:  Reviewed past medical,surgical, social, obstetrical and family history.  Reviewed problem list, medications and allergies. Physical Assessment:   Vitals:    04/16/22 1530  BP: 119/74  Pulse: 78  Weight: 128 lb 12.8 oz (58.4 kg)  Body mass index is 21.43 kg/m.           Physical Examination:   General appearance: alert, well appearing, and in no distress  Mental status: normal mood, behavior, speech, dress, motor activity, and thought processes  Skin: warm & dry   Extremities: Edema: None    Cardiovascular: normal heart rate noted  Respiratory: normal respiratory effort, no distress  Abdomen: gravid, soft, non-tenderCervical exam performed  Pelvic: Cervical exam performed  Dilation: Closed Effacement (%): Thick Station: -3  Fetal Status:     Movement: Present Presentation: Vertex  Fetal Surveillance Testing today: 36+2 wks,cephalic,FHR 127 bpm,afi 16.8 cm,BPP 8/8,EFW 2286 g 6.2%,posterior placenta gr 2,RI .66,.69,.67=88% (limited because of fetal breathing)   Chaperone:  pt declined     No results found for this or any previous visit (from the past 24 hour(s)).   Assessment & Plan:  High-risk pregnancy: G1P0000 at [redacted]w[redacted]d with an Estimated Date of Delivery: 05/12/22   1) FGR -BPP 8/8, doppler normal as above -continue twice weekly testing -scheduled today for IOL @ 38wk- 9/20 [place]  2) Chlamydia -recently treated -not to be discussed while other people are present  Meds: No orders of the defined types were placed in this encounter.   Labs/procedures today: GBS collected  today  Treatment Plan:  as outlined above  Reviewed: Preterm labor symptoms and general obstetric precautions including but not limited to vaginal bleeding, contractions, leaking of fluid and fetal movement were reviewed in detail with the patient.  All questions were answered. Pt has home bp cuff. Check bp weekly, let us know if >140/90.   Follow-up: Return for twice weekly as scheduled.   Future Appointments  Date Time Provider Department Center  04/20/2022  2:30 PM [facility] NURSE [facility]  04/23/2022  1:30 PM [facility] - [facility] [place] [facility] None   04/23/2022  3:10 PM Myna Hidalgo, DO [facility]  04/27/2022  2:50 PM [facility] NURSE [facility]  04/29/2022 12:00 AM [facility] SCHED ROOM [facility] None  04/30/2022  8:30 AM [facility] - [facility] [place] [facility] None  04/30/2022 10:10 AM Lazaro Arms, MD [facility]  05/04/2022  2:50 PM [facility] NURSE [facility]  05/07/2022  1:30 PM [facility] - [facility] [place] [facility] None  05/07/2022  2:30 PM Eure, Amaryllis Dyke, MD [facility]    Orders Placed This Encounter  Procedures   Culture, beta strep (group b only)    Myna Hidalgo, DO Attending Obstetrician & Gynecologist, [facility] [employer], [facility]

## 2022-04-16 NOTE — Progress Notes (Signed)
[place] 36+2 wks,cephalic,FHR 127 bpm,afi 16.8 cm,BPP 8/8,EFW 2286 g 6.2%,posterior placenta gr 2,RI .66,.69,.67=88% (limited because of fetal breathing)

## 2022-04-19 LAB — CULTURE, BETA STREP (GROUP B ONLY): Strep Gp B Culture: POSITIVE — AB

## 2022-04-20 ENCOUNTER — Ambulatory Visit (INDEPENDENT_AMBULATORY_CARE_PROVIDER_SITE_OTHER): Payer: Medicaid Other | Admitting: *Deleted

## 2022-04-20 VITALS — BP 117/71 | HR 83 | Wt 129.0 lb

## 2022-04-20 DIAGNOSIS — Z3A36 36 weeks gestation of pregnancy: Secondary | ICD-10-CM

## 2022-04-20 DIAGNOSIS — O0993 Supervision of high risk pregnancy, unspecified, third trimester: Secondary | ICD-10-CM | POA: Diagnosis not present

## 2022-04-20 DIAGNOSIS — O365990 Maternal care for other known or suspected poor fetal growth, unspecified trimester, not applicable or unspecified: Secondary | ICD-10-CM | POA: Diagnosis not present

## 2022-04-20 NOTE — Progress Notes (Signed)
   NURSE VISIT- NST  SUBJECTIVE:  Yolanda Myers is a 21 y.o. G1P0000 female at [redacted]w[redacted]d, here for a NST for pregnancy complicated by FGR.  She reports active fetal movement, contractions: none, vaginal bleeding: none, membranes: intact.   OBJECTIVE:  BP 117/71   Pulse 83   Wt 129 lb (58.5 kg)   LMP 08/05/2021   BMI 21.47 kg/m   Appears well, no apparent distress  No results found for this or any previous visit (from the past 24 hour(s)).  NST: FHR baseline 130 bpm, Variability: moderate, Accelerations:present, Decelerations:  Absent= Cat 1/reactive Toco: none   ASSESSMENT: G1P0000 at [redacted]w[redacted]d with FGR NST reactive  PLAN: EFM strip reviewed by Joellyn Haff, CNM, [facility]   Recommendations: keep next appointment as scheduled    Jobe Marker  04/20/2022 3:24 PM

## 2022-04-22 ENCOUNTER — Other Ambulatory Visit: Payer: Self-pay | Admitting: Advanced Practice Midwife

## 2022-04-22 ENCOUNTER — Other Ambulatory Visit: Payer: Self-pay | Admitting: Obstetrics & Gynecology

## 2022-04-22 DIAGNOSIS — O365931 Maternal care for other known or suspected poor fetal growth, third trimester, fetus 1: Secondary | ICD-10-CM

## 2022-04-23 ENCOUNTER — Encounter (HOSPITAL_COMMUNITY): Payer: Self-pay

## 2022-04-23 ENCOUNTER — Ambulatory Visit (INDEPENDENT_AMBULATORY_CARE_PROVIDER_SITE_OTHER): Payer: Medicaid Other

## 2022-04-23 ENCOUNTER — Telehealth (HOSPITAL_COMMUNITY): Payer: Self-pay | Admitting: *Deleted

## 2022-04-23 ENCOUNTER — Encounter: Payer: Self-pay | Admitting: Obstetrics & Gynecology

## 2022-04-23 ENCOUNTER — Ambulatory Visit (INDEPENDENT_AMBULATORY_CARE_PROVIDER_SITE_OTHER): Payer: Medicaid Other | Admitting: Obstetrics & Gynecology

## 2022-04-23 VITALS — BP 117/73 | HR 72 | Wt 129.6 lb

## 2022-04-23 DIAGNOSIS — Z3A37 37 weeks gestation of pregnancy: Secondary | ICD-10-CM

## 2022-04-23 DIAGNOSIS — O365931 Maternal care for other known or suspected poor fetal growth, third trimester, fetus 1: Secondary | ICD-10-CM

## 2022-04-23 DIAGNOSIS — Z113 Encounter for screening for infections with a predominantly sexual mode of transmission: Secondary | ICD-10-CM

## 2022-04-23 DIAGNOSIS — Z8619 Personal history of other infectious and parasitic diseases: Secondary | ICD-10-CM

## 2022-04-23 NOTE — Progress Notes (Signed)
[place] 37+2 wks,cephalic,BPP 8/8,posterior placenta gr 1,AFI 19 cm,FHR 127 bpm,RI .58,.63,.61=72%

## 2022-04-23 NOTE — Telephone Encounter (Signed)
Preadmission screen  

## 2022-04-23 NOTE — Progress Notes (Signed)
HIGH-RISK PREGNANCY VISIT [name] MRN [id number]  Date of birth: 2001-06-04 Chief Complaint:   Routine Prenatal Visit  History of Present Illness:   Yolanda Myers is a 21 y.o. G42P0000 female at [redacted]w[redacted]d with an Estimated Date of Delivery: 05/12/22 being seen today for ongoing management of a high-risk pregnancy complicated by:  Fetal growth restriction GBS positive Anxiety Chlamydia    Today she reports  occasional pelvic pressure .   Contractions: Not present. Vag. Bleeding: None.  Movement: Present. denies leaking of fluid.      02/23/2022   12:13 PM 10/30/2021    3:35 PM 08/15/2019    2:20 PM 03/02/2018    9:48 AM 11/17/2016   12:53 PM  Depression screen PHQ 2/9  Decreased Interest 0 1 3 1 1   Down, Depressed, Hopeless 1 1 1 2 2   PHQ - 2 Score 1 2 4 3 3   Altered sleeping 1 2 0 1 1  Tired, decreased energy 1 1 1 2 3   Change in appetite 0 0 1 0 0  Feeling bad or failure about yourself  1 1 1  0 2  Trouble concentrating 0 0 0 0 1  Moving slowly or fidgety/restless 0 0 0 0 0  Suicidal thoughts 0 0 1 0 1  PHQ-9 Score 4 6 8 6 11   Difficult doing work/chores   Somewhat difficult Somewhat difficult      Current Outpatient Medications  Medication Instructions   Prenat w/o A-FeCbGl-DSS-FA-DHA (CITRANATAL 90 DHA) 90-1 & 300 MG MISC 1 tablet, Oral, Daily   Prenatal Vit-Fe Fumarate-FA (PRENATAL VITAMIN) 27-0.8 MG TABS 1 tablet, Oral, Daily   terconazole (TERAZOL 7) 0.4 % vaginal cream 1 applicator, Vaginal, Daily at bedtime     Review of Systems:   Pertinent items are noted in HPI Denies abnormal vaginal discharge w/ itching/odor/irritation, headaches, visual changes, shortness of breath, chest pain, abdominal pain, severe nausea/vomiting, or problems with urination or bowel movements unless otherwise stated above. Pertinent History Reviewed:  Reviewed past medical,surgical, social, obstetrical and family history.  Reviewed problem list, medications and  allergies. Physical Assessment:   Vitals:   04/23/22 1406  BP: 117/73  Pulse: 72  Weight: 129 lb 9.6 oz (58.8 kg)  Body mass index is 21.57 kg/m.           Physical Examination:   General appearance: alert, well appearing, and in no distress  Mental status: normal mood, behavior, speech, dress, motor activity, and thought processes  Skin: warm & dry   Extremities: Edema: None    Cardiovascular: normal heart rate noted  Respiratory: normal respiratory effort, no distress  Abdomen: gravid, soft, non-tender  Pelvic: Cervical exam performed  Dilation: Closed Effacement (%): 30 Station: -3  Fetal Status:     Movement: Present Presentation: Vertex  Fetal Surveillance Testing today: ,cephalic,BPP 8/8,posterior placenta gr 1,AFI 19 cm,FHR 127 bpm,RI .58,.63,.61=72%   Chaperone: N/A    No results found for this or any previous visit (from the past 24 hour(s)).   Assessment & Plan:  High-risk pregnancy: G1P0000 at [redacted]w[redacted]d with an Estimated Date of Delivery: 05/12/22   1) FGR BPP 8/8 with normal dopplers -scheduled for IOL @ 38wk  2) GBS postive- PCN in labor 3) Chlamydia, s/p treatment, []  TOC today  Labs/procedures today: BPP  Treatment Plan:  as outlined above  Reviewed: Preterm labor symptoms and general obstetric precautions including but not limited to vaginal bleeding, contractions, leaking of fluid and fetal movement were  reviewed in detail with the patient.  All questions were answered.   Follow-up: Return for twice weekly as scheduled.   Future Appointments  Date Time Provider Department Center  04/27/2022  2:50 PM [facility] NURSE [facility]  04/29/2022 12:00 AM [facility] SCHED ROOM [facility] None    Orders Placed This Encounter  Procedures   GC/Chlamydia Probe Amp    Myna Hidalgo, DO Attending Obstetrician & Gynecologist, [facility] for [employer], [facility]

## 2022-04-24 ENCOUNTER — Telehealth (HOSPITAL_COMMUNITY): Payer: Self-pay | Admitting: *Deleted

## 2022-04-24 NOTE — Telephone Encounter (Signed)
Preadmission screen  

## 2022-04-26 LAB — GC/CHLAMYDIA PROBE AMP
Chlamydia trachomatis, NAA: NEGATIVE
Neisseria Gonorrhoeae by PCR: NEGATIVE

## 2022-04-27 ENCOUNTER — Ambulatory Visit (INDEPENDENT_AMBULATORY_CARE_PROVIDER_SITE_OTHER): Payer: Medicaid Other | Admitting: *Deleted

## 2022-04-27 ENCOUNTER — Telehealth (HOSPITAL_COMMUNITY): Payer: Self-pay | Admitting: *Deleted

## 2022-04-27 ENCOUNTER — Encounter: Payer: Self-pay | Admitting: *Deleted

## 2022-04-27 VITALS — BP 124/70 | HR 85 | Wt 130.0 lb

## 2022-04-27 DIAGNOSIS — O365990 Maternal care for other known or suspected poor fetal growth, unspecified trimester, not applicable or unspecified: Secondary | ICD-10-CM | POA: Diagnosis not present

## 2022-04-27 DIAGNOSIS — Z3A37 37 weeks gestation of pregnancy: Secondary | ICD-10-CM | POA: Diagnosis not present

## 2022-04-27 DIAGNOSIS — O0993 Supervision of high risk pregnancy, unspecified, third trimester: Secondary | ICD-10-CM | POA: Diagnosis not present

## 2022-04-27 NOTE — Progress Notes (Cosign Needed Addendum)
   NURSE VISIT- NST  SUBJECTIVE:  Yolanda Myers is a 21 y.o. G1P0000 female at [redacted]w[redacted]d, here for a NST for pregnancy complicated by FGR.  She reports active fetal movement, contractions: none, vaginal bleeding: none, membranes: intact.   OBJECTIVE:  BP 124/70   Pulse 85   Wt 130 lb (59 kg)   LMP 08/05/2021   BMI 21.63 kg/m   Appears well, no apparent distress  No results found for this or any previous visit (from the past 24 hour(s)).  NST: FHR baseline 130 bpm, Variability: moderate, Accelerations:present, Decelerations:  Absent= Cat 1/reactive Toco: none   ASSESSMENT: G1P0000 at [redacted]w[redacted]d with FGR NST reactive  PLAN: EFM strip reviewed by Nigel Berthold, CNM   Recommendations: keep next appointment as scheduled    Alice Rieger  04/27/2022 2:44 PM  Chart reviewed for nurse visit. Agree with plan of care.  Christin Fudge, [place] 04/27/2022 6:39 PM

## 2022-04-27 NOTE — Telephone Encounter (Signed)
Preadmission screen  

## 2022-04-29 ENCOUNTER — Other Ambulatory Visit: Payer: Self-pay

## 2022-04-29 ENCOUNTER — Inpatient Hospital Stay (HOSPITAL_COMMUNITY): Payer: Medicaid Other

## 2022-04-29 ENCOUNTER — Inpatient Hospital Stay (HOSPITAL_COMMUNITY)
Admission: AD | Admit: 2022-04-29 | Discharge: 2022-05-03 | DRG: 807 | Disposition: A | Discharge status: Home / Self Care | Payer: Medicaid Other | Attending: Obstetrics and Gynecology | Admitting: Obstetrics and Gynecology

## 2022-04-29 ENCOUNTER — Other Ambulatory Visit: Payer: Self-pay | Admitting: Advanced Practice Midwife

## 2022-04-29 ENCOUNTER — Encounter (HOSPITAL_COMMUNITY): Payer: Self-pay | Admitting: Family Medicine

## 2022-04-29 ENCOUNTER — Encounter: Payer: Self-pay | Admitting: Obstetrics & Gynecology

## 2022-04-29 DIAGNOSIS — M439 Deforming dorsopathy, unspecified: Secondary | ICD-10-CM | POA: Diagnosis present

## 2022-04-29 DIAGNOSIS — O99892 Other specified diseases and conditions complicating childbirth: Secondary | ICD-10-CM | POA: Diagnosis present

## 2022-04-29 DIAGNOSIS — Z3A38 38 weeks gestation of pregnancy: Secondary | ICD-10-CM

## 2022-04-29 DIAGNOSIS — B951 Streptococcus, group B, as the cause of diseases classified elsewhere: Secondary | ICD-10-CM | POA: Diagnosis present

## 2022-04-29 DIAGNOSIS — Z87891 Personal history of nicotine dependence: Secondary | ICD-10-CM | POA: Diagnosis not present

## 2022-04-29 DIAGNOSIS — O365930 Maternal care for other known or suspected poor fetal growth, third trimester, not applicable or unspecified: Secondary | ICD-10-CM | POA: Diagnosis present

## 2022-04-29 DIAGNOSIS — O99820 Streptococcus B carrier state complicating pregnancy: Secondary | ICD-10-CM | POA: Diagnosis not present

## 2022-04-29 DIAGNOSIS — O365990 Maternal care for other known or suspected poor fetal growth, unspecified trimester, not applicable or unspecified: Principal | ICD-10-CM | POA: Diagnosis present

## 2022-04-29 DIAGNOSIS — O99824 Streptococcus B carrier state complicating childbirth: Secondary | ICD-10-CM | POA: Diagnosis present

## 2022-04-29 DIAGNOSIS — Z23 Encounter for immunization: Secondary | ICD-10-CM

## 2022-04-29 HISTORY — DX: Chronic kidney disease, unspecified: N18.9

## 2022-04-29 LAB — TYPE AND SCREEN
ABO/RH(D): O POS
Antibody Screen: NEGATIVE

## 2022-04-29 LAB — CBC
HCT: 35.4 % — ABNORMAL LOW (ref 36.0–46.0)
Hemoglobin: 12.1 g/dL (ref 12.0–15.0)
MCH: 32.4 pg (ref 26.0–34.0)
MCHC: 34.2 g/dL (ref 30.0–36.0)
MCV: 94.7 fL (ref 80.0–100.0)
Platelets: 135 10*3/uL — ABNORMAL LOW (ref 150–400)
RBC: 3.74 MIL/uL — ABNORMAL LOW (ref 3.87–5.11)
RDW: 13.9 % (ref 11.5–15.5)
WBC: 7.2 10*3/uL (ref 4.0–10.5)
nRBC: 0 % (ref 0.0–0.2)

## 2022-04-29 MED ORDER — ONDANSETRON HCL 4 MG/2ML IJ SOLN
4.0000 mg | Freq: Four times a day (QID) | INTRAMUSCULAR | Status: DC | PRN
  Administered 2022-04-30 – 2022-05-01 (×2): 4 mg via INTRAVENOUS
  Filled 2022-04-29 (×2): qty 2

## 2022-04-29 MED ORDER — LACTATED RINGERS IV SOLN: INTRAVENOUS | Status: DC

## 2022-04-29 MED ORDER — SOD CITRATE-CITRIC ACID 500-334 MG/5ML PO SOLN: 30.0000 mL | ORAL | Status: DC | PRN

## 2022-04-29 MED ORDER — OXYTOCIN BOLUS FROM INFUSION
333.0000 mL | Freq: Once | INTRAVENOUS | Status: AC
  Administered 2022-05-01: 333 mL via INTRAVENOUS

## 2022-04-29 MED ORDER — LACTATED RINGERS IV SOLN: 500.0000 mL | INTRAVENOUS | Status: DC | PRN

## 2022-04-29 MED ORDER — MISOPROSTOL 25 MCG QUARTER TABLET: 25.0000 ug | ORAL_TABLET | Freq: Once | ORAL | Status: DC

## 2022-04-29 MED ORDER — OXYTOCIN-SODIUM CHLORIDE 30-0.9 UT/500ML-% IV SOLN
2.5000 [IU]/h | INTRAVENOUS | Status: DC
  Administered 2022-05-01: 2.5 [IU]/h via INTRAVENOUS
  Filled 2022-04-29: qty 500

## 2022-04-29 MED ORDER — MISOPROSTOL 50MCG HALF TABLET
50.0000 ug | ORAL_TABLET | Freq: Once | ORAL | Status: AC
  Administered 2022-04-29: 50 ug via ORAL
  Filled 2022-04-29: qty 1

## 2022-04-29 MED ORDER — PENICILLIN G POT IN DEXTROSE 60000 UNIT/ML IV SOLN
3.0000 10*6.[IU] | INTRAVENOUS | Status: DC
  Administered 2022-04-30 – 2022-05-01 (×7): 3 10*6.[IU] via INTRAVENOUS
  Filled 2022-04-29 (×7): qty 50

## 2022-04-29 MED ORDER — ACETAMINOPHEN 325 MG PO TABS
650.0000 mg | ORAL_TABLET | ORAL | Status: DC | PRN
  Administered 2022-04-30 (×2): 650 mg via ORAL
  Filled 2022-04-29: qty 2

## 2022-04-29 MED ORDER — MISOPROSTOL 25 MCG QUARTER TABLET
25.0000 ug | ORAL_TABLET | Freq: Once | ORAL | Status: AC
  Administered 2022-04-29: 25 ug via VAGINAL
  Filled 2022-04-29: qty 1

## 2022-04-29 MED ORDER — OXYCODONE-ACETAMINOPHEN 5-325 MG PO TABS: 2.0000 | ORAL_TABLET | ORAL | Status: DC | PRN

## 2022-04-29 MED ORDER — LIDOCAINE HCL (PF) 1 % IJ SOLN: 30.0000 mL | INTRAMUSCULAR | Status: DC | PRN

## 2022-04-29 MED ORDER — FENTANYL CITRATE (PF) 100 MCG/2ML IJ SOLN
100.0000 ug | INTRAMUSCULAR | Status: DC | PRN
  Administered 2022-05-01 (×3): 100 ug via INTRAVENOUS
  Filled 2022-04-29 (×3): qty 2

## 2022-04-29 MED ORDER — OXYCODONE-ACETAMINOPHEN 5-325 MG PO TABS: 1.0000 | ORAL_TABLET | ORAL | Status: DC | PRN

## 2022-04-29 MED ORDER — SODIUM CHLORIDE 0.9 % IV SOLN
5.0000 10*6.[IU] | Freq: Once | INTRAVENOUS | Status: AC
  Administered 2022-04-29: 5 10*6.[IU] via INTRAVENOUS
  Filled 2022-04-29: qty 5

## 2022-04-29 MED ORDER — TERBUTALINE SULFATE 1 MG/ML IJ SOLN: 0.2500 mg | Freq: Once | INTRAMUSCULAR | Status: DC | PRN

## 2022-04-29 MED ORDER — MISOPROSTOL 50MCG HALF TABLET
50.0000 ug | ORAL_TABLET | Freq: Once | ORAL | Status: AC
  Administered 2022-04-30: 50 ug via BUCCAL
  Filled 2022-04-29: qty 1

## 2022-04-29 MED ORDER — ZOLPIDEM TARTRATE 5 MG PO TABS: 5.0000 mg | ORAL_TABLET | Freq: Every evening | ORAL | Status: DC | PRN

## 2022-04-29 NOTE — H&P (Cosign Needed Addendum)
Patient ID: Yolanda Myers, female   DOB: 10-07-2000, 21 y.o.   MRN: [id number]  LABOR AND DELIVERY ADMISSION HISTORY AND PHYSICAL NOTE  Yolanda Myers is a 21 y.o. female G1P0000 with IUP at [redacted]w[redacted]d by LMP presenting for IOL for fetal growth restriction (6th%).  She reports positive fetal movement. She denies leakage of fluid or vaginal bleeding.  Prenatal History/Complications: PNC at [facility] Pregnancy complications:  - FGR - GBS + - mild scoliosis  Past Medical History: Past Medical History:  Diagnosis Date   Anxiety    Chronic kidney disease    Frequent UTI    Heavy menses    Migraine headache    Mild scoliosis     Past Surgical History: Past Surgical History:  Procedure Laterality Date   NO PAST SURGERIES      Obstetrical History: OB History     Gravida  1   Para  0   Term  0   Preterm  0   AB  0   Living  0      SAB  0   IAB  0   Ectopic  0   Multiple  0   Live Births  0           Social History: Social History   Socioeconomic History   Marital status: Significant Other    Spouse name: John   Number of children: Not on file   Years of education: Not on file   Highest education level: Not on file  Occupational History   Not on file  Tobacco Use   Smoking status: Former    Types: Cigarettes, E-cigarettes   Smokeless tobacco: Never  Vaping Use   Vaping Use: Former  Substance and Sexual Activity   Alcohol use: Not Currently    Comment: Occasional   Drug use: No   Sexual activity: Yes    Birth control/protection: None  Other Topics Concern   Not on file  Social History Narrative   Yolanda Myers is a 21 yo girl.   She is in the process of getting her GED.   She lives with her mom only.   She has no siblings.   She enjoys painting, drawing, and being outside.   Social Determinants of Health   Financial Resource Strain: High Risk (02/23/2022)   Overall Financial Resource Strain (CARDIA)    Difficulty of Paying Living  Expenses: Hard  Food Insecurity: Food Insecurity Present (04/29/2022)   Hunger Vital Sign    Worried About Running Out of Food in the Last Year: Sometimes true    Ran Out of Food in the Last Year: Sometimes true  Transportation Needs: Unmet Transportation Needs (04/29/2022)   PRAPARE - Transportation    Lack of Transportation (Medical): No    Lack of Transportation (Non-Medical): Yes  Physical Activity: Sufficiently Active (02/23/2022)   Exercise Vital Sign    Days of Exercise per Week: 4 days    Minutes of Exercise per Session: 40 min  Stress: Stress Concern Present (02/23/2022)   [place]    Feeling of Stress : To some extent  Social Connections: Socially Isolated (02/23/2022)   Social Connection and Isolation Panel [NHANES]    Frequency of Communication with Friends and Family: Twice a week    Frequency of Social Gatherings with Friends and Family: Twice a week    Attends Religious Services: Never    Marine scientist or Organizations: No  Attends Archivist Meetings: Never    Marital Status: Never married    Family History: Family History  Problem Relation Age of Onset   Asthma Mother    Asthma Maternal Grandmother    Hearing loss Maternal Grandmother    Heart disease Maternal Grandmother    Hypertension Maternal Grandmother    Depression Maternal Grandmother    Seizures Maternal Grandmother    COPD Maternal Grandmother    Multiple sclerosis Maternal Grandmother    Asthma Maternal Grandfather    Hypertension Maternal Grandfather     Allergies: Allergies  Allergen Reactions   Imitrex [Sumatriptan] Other (See Comments)    flushing   Sulfa Antibiotics     Medications Prior to Admission  Medication Sig Dispense Refill Last Dose   Prenat w/o A-FeCbGl-DSS-FA-DHA (CITRANATAL 90 DHA) 90-1 & 300 MG MISC Take 1 tablet by mouth daily. 30 each 6    Prenatal Vit-Fe Fumarate-FA (PRENATAL  VITAMIN) 27-0.8 MG TABS Take 1 tablet by mouth daily. (Patient not taking: Reported on 03/30/2022) 30 tablet 12    terconazole (TERAZOL 7) 0.4 % vaginal cream Place 1 applicator vaginally at bedtime. (Patient not taking: Reported on 04/06/2022) 45 g 0      Review of Systems  All systems reviewed and negative except as stated in HPI  Physical Exam Blood pressure (!) 122/94, pulse 98, temperature 98.4 F (36.9 C), temperature source Oral, resp. rate 18, height 5\' 5"  (1.651 m), weight 60.1 kg, last menstrual period 08/05/2021. General appearance: alert, oriented, NAD Lungs: normal respiratory effort Heart: regular rate Abdomen: soft, non-tender; gravid, FH appropriate for GA Extremities: No calf swelling or tenderness Presentation: cephalic by Leopold's  Fetal monitoring: 130bpm, moderate variability, accels present, decels absent. Uterine activity: no contractions Dilation: Closed Effacement (%): Thick Station: -3 Exam by:: Dr. Trina Ao  Prenatal labs: ABO, Rh: --/--/O POS (09/20 1937) Antibody: NEG (09/20 1937) Rubella: 1.47 (03/23 1608) RPR: Non Reactive (07/18 0850)  HBsAg: Negative (03/23 1608)  HIV: Non Reactive (07/18 0850)  GC/Chlamydia: Positive 04/04/2022 GBS: Positive/-- (09/07 1620)   Prenatal Transfer Tool  Maternal Diabetes: No Genetic Screening: Normal Maternal Ultrasounds/Referrals: IUGR Fetal Ultrasounds or other Referrals:  None Maternal Substance Abuse:  No Significant Maternal Medications:  None Significant Maternal Lab Results: Group B Strep positive  Results for orders placed or performed during the hospital encounter of 04/29/22 (from the past 24 hour(s))  CBC   Collection Time: 04/29/22  7:37 PM  Result Value Ref Range   WBC 7.2 4.0 - 10.5 K/uL   RBC 3.74 (L) 3.87 - 5.11 MIL/uL   Hemoglobin 12.1 12.0 - 15.0 g/dL   HCT 35.4 (L) 36.0 - 46.0 %   MCV 94.7 80.0 - 100.0 fL   MCH 32.4 26.0 - 34.0 pg   MCHC 34.2 30.0 - 36.0 g/dL   RDW 13.9 11.5 - 15.5 %    Platelets 135 (L) 150 - 400 K/uL   nRBC 0.0 0.0 - 0.2 %  Type and screen   Collection Time: 04/29/22  7:37 PM  Result Value Ref Range   ABO/RH(D) O POS    Antibody Screen NEG    Sample Expiration      05/02/2022,2359 Performed at [facility], [address], [place], [place] [address]     Patient Active Problem List   Diagnosis Date Noted   Positive testing for group B Streptococcus 04/29/2022   Fetal growth restriction antepartum 02/26/2022   History of renal calculi 02/23/2022  Supervision of high-risk pregnancy 10/30/2021   Lymph node enlargement 06/05/2020   Depression, major, single episode, mild ([place]) 08/20/2019   Migraine without aura and without status migrainosus, not intractable 04/01/2018   Episodic tension-type headache, not intractable 04/01/2018   Syncopal seizure ([place]) 04/01/2018   Closed head injury with brief loss of consciousness ([place]) 04/01/2018   Adolescent idiopathic scoliosis of thoracolumbar region 04/01/2018   Nonintractable episodic headache 03/17/2018   Syncope 03/17/2018   Depression 03/02/2018   Curvature of spine 03/02/2018   Moderate single current episode of major depressive disorder ([place]) 07/08/2016   ACHILLES TENDINITIS 11/10/2009    Assessment: Yolanda Myers is a 21 y.o. G1P0000 at [redacted]w[redacted]d here for IOL for FGR (6th%)  #Labor: Cervical ripening with cytotec 25/25,  #Pain: Desires unmedicated birth. Pain meds available as needed #FWB: Category 1 #ID:  GBS +, PCN started  #MOF: Breast #MOC:undecided   Hollace Hayward, SNM 04/29/2022, 10:33 PM   CNM attestation:  I have seen and examined this patient; I agree with above documentation in the student nurse midwife's note.   Yolanda Myers is a 21 y.o. G1P0000 here for IOL FGR, nl dopplers  PE: BP (!) 122/94   Pulse 98   Temp 98.4 F (36.9 C) (Oral)   Resp 18   Ht 5\' 5"  (1.651 m)   Wt 60.1 kg   LMP 08/05/2021   BMI 22.05 kg/m  Gen: calm comfortable,  NAD Resp: normal effort, no distress Abd: gravid  ROS, labs, PMH reviewed  Plan: -Admit to Labor and Delivery -Plan cx ripening with cytotec followed by foley, then advance to Pit/AROM prn -PCN for GBS ppx -Watch BPs- most likely due to anxiety from hospital arrival -[place] 04/29/2022, 10:44 PM

## 2022-04-29 NOTE — Progress Notes (Deleted)
Patient ID: Yolanda Myers, female   DOB: 10-07-2000, 21 y.o.   MRN: [id number]  LABOR AND DELIVERY ADMISSION HISTORY AND PHYSICAL NOTE  Yolanda Myers is a 21 y.o. female G1P0000 with IUP at [redacted]w[redacted]d by LMP presenting for IOL for fetal growth restriction (6th%).  She reports positive fetal movement. She denies leakage of fluid or vaginal bleeding.  Prenatal History/Complications: PNC at [facility] Pregnancy complications:  - FGR - GBS + - mild scoliosis  Past Medical History: Past Medical History:  Diagnosis Date   Anxiety    Chronic kidney disease    Frequent UTI    Heavy menses    Migraine headache    Mild scoliosis     Past Surgical History: Past Surgical History:  Procedure Laterality Date   NO PAST SURGERIES      Obstetrical History: OB History     Gravida  1   Para  0   Term  0   Preterm  0   AB  0   Living  0      SAB  0   IAB  0   Ectopic  0   Multiple  0   Live Births  0           Social History: Social History   Socioeconomic History   Marital status: Significant Other    Spouse name: Yolanda Myers   Number of children: Not on file   Years of education: Not on file   Highest education level: Not on file  Occupational History   Not on file  Tobacco Use   Smoking status: Former    Types: Cigarettes, E-cigarettes   Smokeless tobacco: Never  Vaping Use   Vaping Use: Former  Substance and Sexual Activity   Alcohol use: Not Currently    Comment: Occasional   Drug use: No   Sexual activity: Yes    Birth control/protection: None  Other Topics Concern   Not on file  Social History Narrative   Yolanda Myers is a 21 yo girl.   She is in the process of getting her GED.   She lives with her mom only.   She has no siblings.   She enjoys painting, drawing, and being outside.   Social Determinants of Health   Financial Resource Strain: High Risk (02/23/2022)   Overall Financial Resource Strain (CARDIA)    Difficulty of Paying Living  Expenses: Hard  Food Insecurity: Food Insecurity Present (04/29/2022)   Hunger Vital Sign    Worried About Running Out of Food in the Last Year: Sometimes true    Ran Out of Food in the Last Year: Sometimes true  Transportation Needs: Unmet Transportation Needs (04/29/2022)   PRAPARE - Transportation    Lack of Transportation (Medical): No    Lack of Transportation (Non-Medical): Yes  Physical Activity: Sufficiently Active (02/23/2022)   Exercise Vital Sign    Days of Exercise per Week: 4 days    Minutes of Exercise per Session: 40 min  Stress: Stress Concern Present (02/23/2022)   [place]    Feeling of Stress : To some extent  Social Connections: Socially Isolated (02/23/2022)   Social Connection and Isolation Panel [NHANES]    Frequency of Communication with Friends and Family: Twice a week    Frequency of Social Gatherings with Friends and Family: Twice a week    Attends Religious Services: Never    Marine scientist or Organizations: No  Attends Archivist Meetings: Never    Marital Status: Never married    Family History: Family History  Problem Relation Age of Onset   Asthma Mother    Asthma Maternal Grandmother    Hearing loss Maternal Grandmother    Heart disease Maternal Grandmother    Hypertension Maternal Grandmother    Depression Maternal Grandmother    Seizures Maternal Grandmother    COPD Maternal Grandmother    Multiple sclerosis Maternal Grandmother    Asthma Maternal Grandfather    Hypertension Maternal Grandfather     Allergies: Allergies  Allergen Reactions   Imitrex [Sumatriptan] Other (See Comments)    flushing   Sulfa Antibiotics     Medications Prior to Admission  Medication Sig Dispense Refill Last Dose   Prenat w/o A-FeCbGl-DSS-FA-DHA (CITRANATAL 90 DHA) 90-1 & 300 MG MISC Take 1 tablet by mouth daily. 30 each 6    Prenatal Vit-Fe Fumarate-FA (PRENATAL  VITAMIN) 27-0.8 MG TABS Take 1 tablet by mouth daily. (Patient not taking: Reported on 03/30/2022) 30 tablet 12    terconazole (TERAZOL 7) 0.4 % vaginal cream Place 1 applicator vaginally at bedtime. (Patient not taking: Reported on 04/06/2022) 45 g 0      Review of Systems  All systems reviewed and negative except as stated in HPI  Physical Exam Blood pressure (!) 122/94, pulse 98, temperature 98.4 F (36.9 C), temperature source Oral, resp. rate 18, height 5\' 5"  (1.651 m), weight 60.1 kg, last menstrual period 08/05/2021. General appearance: alert, oriented, NAD Lungs: normal respiratory effort Heart: regular rate Abdomen: soft, non-tender; gravid, FH appropriate for GA Extremities: No calf swelling or tenderness Presentation: cephalic by Leopold's  Fetal monitoring: 130bpm, moderate variability, accels present, decels absent. Uterine activity: no contractions Dilation: Closed Effacement (%): Thick Station: -3 Exam by:: Dr. Trina Ao  Prenatal labs: ABO, Rh: --/--/O POS (09/20 1937) Antibody: NEG (09/20 1937) Rubella: 1.47 (03/23 1608) RPR: Non Reactive (07/18 0850)  HBsAg: Negative (03/23 1608)  HIV: Non Reactive (07/18 0850)  GC/Chlamydia: Positive 04/04/2022 GBS: Positive/-- (09/07 1620)   Prenatal Transfer Tool  Maternal Diabetes: No Genetic Screening: Normal Maternal Ultrasounds/Referrals: IUGR Fetal Ultrasounds or other Referrals:  None Maternal Substance Abuse:  No Significant Maternal Medications:  None Significant Maternal Lab Results: Group B Strep positive  Results for orders placed or performed during the hospital encounter of 04/29/22 (from the past 24 hour(s))  CBC   Collection Time: 04/29/22  7:37 PM  Result Value Ref Range   WBC 7.2 4.0 - 10.5 K/uL   RBC 3.74 (L) 3.87 - 5.11 MIL/uL   Hemoglobin 12.1 12.0 - 15.0 g/dL   HCT 35.4 (L) 36.0 - 46.0 %   MCV 94.7 80.0 - 100.0 fL   MCH 32.4 26.0 - 34.0 pg   MCHC 34.2 30.0 - 36.0 g/dL   RDW 13.9 11.5 - 15.5 %    Platelets 135 (L) 150 - 400 K/uL   nRBC 0.0 0.0 - 0.2 %  Type and screen   Collection Time: 04/29/22  7:37 PM  Result Value Ref Range   ABO/RH(D) O POS    Antibody Screen NEG    Sample Expiration      05/02/2022,2359 Performed at [facility], [address], [place], [place] [address]     Patient Active Problem List   Diagnosis Date Noted   Positive testing for group B Streptococcus 04/29/2022   Fetal growth restriction antepartum 02/26/2022   History of renal calculi 02/23/2022  Supervision of high-risk pregnancy 10/30/2021   Lymph node enlargement 06/05/2020   Depression, major, single episode, mild ([place]) 08/20/2019   Migraine without aura and without status migrainosus, not intractable 04/01/2018   Episodic tension-type headache, not intractable 04/01/2018   Syncopal seizure ([place]) 04/01/2018   Closed head injury with brief loss of consciousness ([place]) 04/01/2018   Adolescent idiopathic scoliosis of thoracolumbar region 04/01/2018   Nonintractable episodic headache 03/17/2018   Syncope 03/17/2018   Depression 03/02/2018   Curvature of spine 03/02/2018   Moderate single current episode of major depressive disorder ([place]) 07/08/2016   ACHILLES TENDINITIS 11/10/2009    Assessment: Yolanda Myers is a 21 y.o. G1P0000 at [redacted]w[redacted]d here for IOL for FGR (6th%)  #Labor: Cervical ripening with cytotec 25/25,  #Pain: Desires unmedicated birth. Pain meds available as needed #FWB: Category 1 #ID:  GBS +, PCN started  #MOF: Breast #MOC:undecided   Hollace Hayward, SNM 04/29/2022, 9:07 PM

## 2022-04-30 ENCOUNTER — Encounter: Payer: Medicaid Other | Admitting: Obstetrics & Gynecology

## 2022-04-30 ENCOUNTER — Other Ambulatory Visit: Payer: Medicaid Other

## 2022-04-30 LAB — RPR: RPR Ser Ql: NONREACTIVE

## 2022-04-30 MED ORDER — TERBUTALINE SULFATE 1 MG/ML IJ SOLN: 0.2500 mg | Freq: Once | INTRAMUSCULAR | Status: DC | PRN

## 2022-04-30 MED ORDER — OXYTOCIN-SODIUM CHLORIDE 30-0.9 UT/500ML-% IV SOLN
1.0000 m[IU]/min | INTRAVENOUS | Status: DC
  Administered 2022-04-30: 2 m[IU]/min via INTRAVENOUS

## 2022-04-30 NOTE — Progress Notes (Signed)
Labor Progress Note BRITTNI HULT is a 21 y.o. G1P0000 at [redacted]w[redacted]d presented for IOL 2/2 FGR  S: Doing well, no concerns at this time.   O:  BP 103/66 (BP Location: Right Arm)   Pulse 72   Temp 98.3 F (36.8 C) (Oral)   Resp 16   Ht 5\' 5"  (1.651 m)   Wt 60.1 kg   LMP 08/05/2021   BMI 22.05 kg/m  EFM: 130bpm/moderate/+accels, no decels  CVE: Dilation: 1 Effacement (%): 50 Cervical Position: Posterior Station: -2 Presentation: Vertex Exam by:: Derrill Memo, CNM   A&P: 21 y.o. G1P0000 [redacted]w[redacted]d here for IOL 2/2 IUGR 6th tile.  #Labor: FB in place. Consider pitocin when out #Pain: Maternally supported #FWB: Cat I #GBS positive, ppx   Azha Constantin Autry-Lott, DO 10:46 AM

## 2022-04-30 NOTE — Progress Notes (Signed)
Labor Progress Note Yolanda Myers is a 21 y.o. G1P0000 at [redacted]w[redacted]d presented for IOL 2/2 FGR  S: Doing well, no concerns at this time.   O:  BP 112/66 (BP Location: Right Arm)   Pulse 72   Temp 98.7 F (37.1 C) (Oral)   Resp 18   Ht 5\' 5"  (1.651 m)   Wt 60.1 kg   LMP 08/05/2021   BMI 22.05 kg/m  EFM: 130bpm/moderate/+accels, no decels  CVE: Dilation: 3.5 Effacement (%): 50 Cervical Position: Posterior Station: -2 Presentation: Vertex Exam by:: Nilsa Nutting RN and Suzanne Boron RN   A&P: 21 y.o. G1P0000 [redacted]w[redacted]d here for IOL 2/2 IUGR 6th tile.  #Labor: On 4 of pitocin. Continue to increase. Consider AROM when appropriate.  #Pain: Maternally supported #FWB: Cat I #GBS positive, ppx   [name], DO 3:00 PM

## 2022-04-30 NOTE — Progress Notes (Addendum)
Yolanda Myers is a 21 y.o. G1P0000 at [redacted]w[redacted]d by LMP admitted for induction of labor due to Poor fetal growth.  Subjective:  Feeling good. Not feeling contractions.    Objective: BP 120/88   Pulse 91   Temp 98.1 F (36.7 C) (Oral)   Resp 16   Ht 5\' 5"  (1.651 m)   Wt 60.1 kg   LMP 08/05/2021   BMI 22.05 kg/m  No intake/output data recorded. No intake/output data recorded.  FHT:  FHR: 130 bpm, variability: moderate,  accelerations:  Present,  decelerations:  Absent UC:   regular, every 2-3 minutes SVE:   Dilation: 1 Effacement (%): 50 Station: -2 Exam by:: Derrill Memo, CNM  Labs: Lab Results  Component Value Date   WBC 7.2 04/29/2022   HGB 12.1 04/29/2022   HCT 35.4 (L) 04/29/2022   MCV 94.7 04/29/2022   PLT 135 (L) 04/29/2022    Assessment / Plan: IOL for FGR; s/p cytotec x 2 doses  Labor: Foley bulb placed, will start pitocin once out. Fetal Wellbeing:  Category I Pain Control:desires unmedicated, open to medications or epidural I/D:   GBS +, PCN given Anticipated MOD:  NSVD  Hollace Hayward, Student-MidWife 04/30/2022, 6:22 AM  Attestation of Supervision of Student:  I confirm that I have verified the information documented in the nurse midwife student's note and that I have also personally reperformed the history, physical exam and all medical decision making activities.  I have verified that all services and findings are accurately documented in this student's note; and I agree with management and plan as outlined in the documentation. I have also made any necessary editorial changes.   Myrtis Ser, [place] for [employer], [facility] 04/30/2022 8:41 AM

## 2022-04-30 NOTE — Progress Notes (Signed)
Patient Vitals for the past 4 hrs:  BP Pulse Resp  04/30/22 2206 (!) 140/85 100 18  04/30/22 2100 120/73 80 16  04/30/22 2030 (!) 100/56 (!) 55 16  04/30/22 2000 106/66 64 19  04/30/22 1931 114/70 77 --  04/30/22 1906 117/82 67 18  04/30/22 1830 106/69 77 18   Ctx are not painful at all, q 1-2 minutes.  FHR Cat 1.  Discussed AROM since ctx are too close together to ^ Pitocin (at 8 mu/min), pt agreeable .AROM w/clear fluid.  Cx 4.5/80/-1.  Continue present management

## 2022-05-01 ENCOUNTER — Encounter (HOSPITAL_COMMUNITY): Payer: Self-pay | Admitting: Family Medicine

## 2022-05-01 DIAGNOSIS — O365930 Maternal care for other known or suspected poor fetal growth, third trimester, not applicable or unspecified: Secondary | ICD-10-CM

## 2022-05-01 DIAGNOSIS — Z3A38 38 weeks gestation of pregnancy: Secondary | ICD-10-CM

## 2022-05-01 DIAGNOSIS — O99820 Streptococcus B carrier state complicating pregnancy: Secondary | ICD-10-CM

## 2022-05-01 LAB — CBC
HCT: 34.5 % — ABNORMAL LOW (ref 36.0–46.0)
Hemoglobin: 11.7 g/dL — ABNORMAL LOW (ref 12.0–15.0)
MCH: 32.1 pg (ref 26.0–34.0)
MCHC: 33.9 g/dL (ref 30.0–36.0)
MCV: 94.5 fL (ref 80.0–100.0)
Platelets: 110 10*3/uL — ABNORMAL LOW (ref 150–400)
RBC: 3.65 MIL/uL — ABNORMAL LOW (ref 3.87–5.11)
RDW: 14.2 % (ref 11.5–15.5)
WBC: 13.1 10*3/uL — ABNORMAL HIGH (ref 4.0–10.5)
nRBC: 0 % (ref 0.0–0.2)

## 2022-05-01 MED ORDER — MEASLES, MUMPS & RUBELLA VAC IJ SOLR: 0.5000 mL | Freq: Once | INTRAMUSCULAR | Status: DC

## 2022-05-01 MED ORDER — PRENATAL MULTIVITAMIN CH
1.0000 | ORAL_TABLET | Freq: Every day | ORAL | Status: DC
  Administered 2022-05-01 – 2022-05-03 (×3): 1 via ORAL
  Filled 2022-05-01 (×3): qty 1

## 2022-05-01 MED ORDER — FLEET ENEMA 7-19 GM/118ML RE ENEM: 1.0000 | ENEMA | Freq: Every day | RECTAL | Status: DC | PRN

## 2022-05-01 MED ORDER — TETANUS-DIPHTH-ACELL PERTUSSIS 5-2.5-18.5 LF-MCG/0.5 IM SUSY: 0.5000 mL | PREFILLED_SYRINGE | Freq: Once | INTRAMUSCULAR | Status: DC

## 2022-05-01 MED ORDER — METHYLERGONOVINE MALEATE 0.2 MG PO TABS: 0.2000 mg | ORAL_TABLET | ORAL | Status: DC | PRN

## 2022-05-01 MED ORDER — ACETAMINOPHEN 325 MG PO TABS
650.0000 mg | ORAL_TABLET | ORAL | Status: DC | PRN
  Administered 2022-05-02: 650 mg via ORAL
  Filled 2022-05-01: qty 2

## 2022-05-01 MED ORDER — DIPHENHYDRAMINE HCL 25 MG PO CAPS: 25.0000 mg | ORAL_CAPSULE | Freq: Four times a day (QID) | ORAL | Status: DC | PRN

## 2022-05-01 MED ORDER — ONDANSETRON HCL 4 MG/2ML IJ SOLN: 4.0000 mg | INTRAMUSCULAR | Status: DC | PRN

## 2022-05-01 MED ORDER — SIMETHICONE 80 MG PO CHEW: 80.0000 mg | CHEWABLE_TABLET | ORAL | Status: DC | PRN

## 2022-05-01 MED ORDER — COCONUT OIL OIL: 1.0000 | TOPICAL_OIL | Status: DC | PRN

## 2022-05-01 MED ORDER — FERROUS SULFATE 325 (65 FE) MG PO TABS
325.0000 mg | ORAL_TABLET | ORAL | Status: DC
  Administered 2022-05-01 – 2022-05-03 (×2): 325 mg via ORAL
  Filled 2022-05-01 (×2): qty 1

## 2022-05-01 MED ORDER — IBUPROFEN 600 MG PO TABS
600.0000 mg | ORAL_TABLET | Freq: Four times a day (QID) | ORAL | Status: DC
  Administered 2022-05-01 – 2022-05-03 (×9): 600 mg via ORAL
  Filled 2022-05-01 (×9): qty 1

## 2022-05-01 MED ORDER — SENNOSIDES-DOCUSATE SODIUM 8.6-50 MG PO TABS
2.0000 | ORAL_TABLET | ORAL | Status: DC
  Administered 2022-05-02 – 2022-05-03 (×2): 2 via ORAL
  Filled 2022-05-01 (×2): qty 2

## 2022-05-01 MED ORDER — ONDANSETRON HCL 4 MG PO TABS: 4.0000 mg | ORAL_TABLET | ORAL | Status: DC | PRN

## 2022-05-01 MED ORDER — BISACODYL 10 MG RE SUPP: 10.0000 mg | Freq: Every day | RECTAL | Status: DC | PRN

## 2022-05-01 MED ORDER — DIBUCAINE (PERIANAL) 1 % EX OINT: 1.0000 | TOPICAL_OINTMENT | CUTANEOUS | Status: DC | PRN

## 2022-05-01 MED ORDER — WITCH HAZEL-GLYCERIN EX PADS: 1.0000 | MEDICATED_PAD | CUTANEOUS | Status: DC | PRN

## 2022-05-01 MED ORDER — MEDROXYPROGESTERONE ACETATE 150 MG/ML IM SUSP: 150.0000 mg | INTRAMUSCULAR | Status: DC | PRN

## 2022-05-01 MED ORDER — METHYLERGONOVINE MALEATE 0.2 MG/ML IJ SOLN: 0.2000 mg | INTRAMUSCULAR | Status: DC | PRN

## 2022-05-01 MED ORDER — BENZOCAINE-MENTHOL 20-0.5 % EX AERO: 1.0000 | INHALATION_SPRAY | CUTANEOUS | Status: DC | PRN

## 2022-05-01 NOTE — Progress Notes (Signed)
Raylene Miyamoto accompanied Janene Harvey to the [facility] on 05/01/2022. Please excuse his absence during this time.      Bonnielee Haff RNC-OB Work Tel: [phone]  Treatment Team:  Attending Provider: Radene Gunning, MD

## 2022-05-01 NOTE — Discharge Summary (Signed)
Postpartum Discharge Summary  Date of Service updated***     [name] DOB: 21-May-2001 MRN: [id number]  Date of admission: 04/29/2022 Delivery date:05/01/2022  Delivering provider: Christin Fudge  Date of discharge: 05/01/2022  Admitting diagnosis: Pregnancy affected by fetal growth restriction [O36.5990] Intrauterine pregnancy: [redacted]w[redacted]d    Secondary diagnosis:  Active Problems:   Curvature of spine   Fetal growth restriction antepartum   Positive testing for group B Streptococcus  Additional problems: ***    Discharge diagnosis: {DX.:23714}                                              Post partum procedures:{Postpartum procedures:23558} Augmentation: AROM, Pitocin, Cytotec, and IP Foley Complications: None  Hospital course: Induction of Labor With Vaginal Delivery   21y.o. yo G1P0000 at 21w3das admitted to the hospital 04/29/2022 for induction of labor.  Indication for induction:  Fetal growth restriction .  Patient had an uncomplicated labor course as follows: Membrane Rupture Time/Date: 10:11 PM ,04/30/2022   Delivery Method:Vaginal, Spontaneous  Episiotomy: None  Lacerations:  None  Details of delivery can be found in separate delivery note.  Patient had a routine postpartum course. Patient is discharged home 05/01/22.  Newborn Data: Birth date:05/01/2022  Birth time:4:56 AM  Gender:Female  Living status:Living  Apgars:7 ,8  Weight:   Magnesium Sulfate received: No BMZ received: No Rhophylac:N/A MMR:N/A T-DaP: declined Flu: {F{TMA:26333}ransfusion:{Transfusion received:30440034}  Physical exam  Vitals:   04/30/22 2206 04/30/22 2233 04/30/22 2300 04/30/22 2330  BP: (!) 140/85 109/75 117/73 123/81  Pulse: 100 71 79 67  Resp: 18  16 16   Temp:  98.1 F (36.7 C)    TempSrc:  Oral    SpO2:      Weight:      Height:       General: {Exam; general:21111117} Lochia: {Desc;  appropriate/inappropriate:30686::"appropriate"} Uterine Fundus: {Desc; firm/soft:30687} Incision: {Exam; incision:21111123} DVT Evaluation: {Exam; dvt:2111122} Labs: Lab Results  Component Value Date   WBC 13.1 (H) 05/01/2022   HGB 11.7 (L) 05/01/2022   HCT 34.5 (L) 05/01/2022   MCV 94.5 05/01/2022   PLT 110 (L) 05/01/2022      Latest Ref Rng & Units 01/31/2022    2:50 AM  CMP  Glucose 70 - 99 mg/dL 84   BUN 6 - 20 mg/dL 5   Creatinine 0.44 - 1.00 mg/dL 0.91   Sodium 135 - 145 mmol/L 132   Potassium 3.5 - 5.1 mmol/L 3.6   Chloride 98 - 111 mmol/L 101   CO2 22 - 32 mmol/L 21   Calcium 8.9 - 10.3 mg/dL 8.6   Total Protein 6.5 - 8.1 g/dL 5.9   Total Bilirubin 0.3 - 1.2 mg/dL 0.8   Alkaline Phos 38 - 126 U/L 63   AST 15 - 41 U/L 16   ALT 0 - 44 U/L 13    Edinburgh Score:     No data to display           After visit meds:  Allergies as of 05/01/2022       Reactions   Imitrex [sumatriptan] Other (See Comments)   flushing   Sulfa Antibiotics      Med Rec must be completed [facility]        Discharge home in stable condition Infant Feeding: {  Baby feeding:23562} Infant Disposition:{CHL IP OB HOME WITH DHRCBU:38453} Discharge instruction: per After Visit Summary and Postpartum booklet. Activity: Advance as tolerated. Pelvic rest for 6 weeks.  Diet: {OB MIWO:03212248} Future Appointments:No future appointments. Follow up Visit:   Please schedule this patient for a In person postpartum visit in 4 weeks with the following provider: Any provider. Additional Postpartum F/U:  Low risk pregnancy complicated by:  FGR Delivery mode:  Vaginal, Spontaneous  Anticipated Birth Control:  Unsure   05/01/2022 Christin Fudge, CNM

## 2022-05-01 NOTE — Lactation Note (Signed)
This note was copied from a baby's chart. Lactation Consultation Note  [name] [id number] Date: 05/01/2022 Reason for consult: Follow-up assessment;Primapara;1st time breastfeeding;Early term 37-38.6wks;Breastfeeding assistance (as LC entered the room, baby was latched for about 5 mins and released. LC assisted to latch on the Left side again , baby6 fed 5 mins with swallows and released. Areola edema noted. LC switched the baby's position to the football / latched.) Age:21 hours LC mentioned to mom due to areola edema, shells are indicated between feedings except when sleeping, prior to every latching - pre pump with the hand pump #21 F. Latch with firm support .  LC stressed the importance of not allowing the baby to nibble onto the breast. Work with baby to open wide and then latch . Football seems to work the best.  Birth parent needs to have family bring a bra for shells.  Maternal Data Has patient been taught Hand Expression?: Yes Does the patient have breastfeeding experience prior to this delivery?: No  Feeding Mother's Current Feeding Choice: Breast Milk and Formula  LATCH Score Latch: Repeated attempts needed to sustain latch, nipple held in mouth throughout feeding, stimulation needed to elicit sucking reflex.  Audible Swallowing: Spontaneous and intermittent  Type of Nipple: Everted at rest and after stimulation  Comfort (Breast/Nipple): Filling, red/small blisters or bruises, mild/mod discomfort  Hold (Positioning): Assistance needed to correctly position infant at breast and maintain latch.  LATCH Score: 7   Lactation Tools Discussed/Used Tools: Shells;Flanges;Pump Flange Size: 21 Breast pump type: Manual Pump Education: Setup, frequency, and cleaning  Interventions Interventions: Breast feeding basics reviewed;Assisted with latch;Skin to skin;Breast massage;Hand express;Pre-pump if needed;Reverse pressure;Breast compression;Support  pillows;Adjust position;Position options;Shells;Hand pump;Education;LC Services brochure  Discharge Pump: Manual WIC Program: Yes  Consult Status Consult Status: Follow-up Date: 05/01/22 Follow-up type: In-patient    [place] 05/01/2022, 10:46 AM

## 2022-05-01 NOTE — Lactation Note (Signed)
This note was copied from a baby's chart. Lactation Consultation Note  [name] Today's Date: 05/01/2022 Reason for consult: Initial assessment;Primapara;1st time breastfeeding;Early term 37-38.6wks ([place] enc birth parent to call with feeding cues) Age:21 hours   Maternal Data Does the patient have breastfeeding experience prior to this delivery?: No  Feeding Mother's Current Feeding Choice: Breast Milk and Formula  LATCH Score - score by the RN  Latch: Grasps breast easily, tongue down, lips flanged, rhythmical sucking.  Audible Swallowing: Spontaneous and intermittent  Type of Nipple: Everted at rest and after stimulation  Comfort (Breast/Nipple): Soft / non-tender  Hold (Positioning): No assistance needed to correctly position infant at breast.  LATCH Score: 10   Lactation Tools Discussed/Used    Interventions Interventions: Breast feeding basics reviewed;Education;LC Services brochure  Discharge [place] Program: Yes Consult Status Consult Status: Follow-up Date: 05/01/22 Follow-up type: In-patient    [place] 05/01/2022, 9:37 AM

## 2022-05-02 MED ORDER — IBUPROFEN 600 MG PO TABS: 600.0000 mg | ORAL_TABLET | Freq: Four times a day (QID) | ORAL | 0 refills | Status: AC | PRN

## 2022-05-02 MED ORDER — INFLUENZA VAC SPLIT QUAD 0.5 ML IM SUSY: 0.5000 mL | PREFILLED_SYRINGE | INTRAMUSCULAR | Status: DC

## 2022-05-02 NOTE — Progress Notes (Signed)
Patient called me into room saying she was upset that father of the baby was upset because not on birth certificate. Charge nurse Cyril Mourning was notified.

## 2022-05-02 NOTE — Lactation Note (Signed)
This note was copied from a baby's chart. Lactation Consultation Note  [name] [id number] Date: 05/02/2022 Reason for consult: Follow-up assessment;Difficult latch;Primapara;1st time breastfeeding;Nipple pain/trauma;Infant weight loss;Breastfeeding assistance;Early term 37-38.6wks (4.29% WL) Age:21 hours  LC called to the patient room.  Per the supporting parent, the birth parent went somewhere.  [place] asked the supporting parent to have the birth parent call when she is ready to be seen by lactation.   Maternal Data    Feeding Mother's Current Feeding Choice: Breast Milk and Formula Nipple Type: Slow - flow  LATCH Score                    Lactation Tools Discussed/Used    Interventions Interventions: Breast feeding basics reviewed;Breast massage;Hand express;Education;Ice;LC Services brochure  Discharge    Consult Status Consult Status: Complete Date: 05/02/22 Follow-up type: Call as needed    [facility] 05/02/2022, 11:24 AM

## 2022-05-02 NOTE — Lactation Note (Signed)
This note was copied from a baby's chart. Lactation Consultation Note  [name] [id number] Date: 05/02/2022 Reason for consult: Follow-up assessment;Difficult latch;Primapara;1st time breastfeeding;Early term 37-38.6wks;Nipple pain/trauma;Infant weight loss;Breastfeeding assistance (4.29% WL) Age:21 hours  LC entered the room and the infant was being burped by the birth parent.  She stated that latching was difficult and she was feeling some pain and tenderness on her nipples and breasts.  LC assessed the breast tissue and there was redness on both nipples.  LC also massaged the breast tissue and the both breasts felt full.  LC noted some areolar edema and encouraged the birth parent to use the shells that had been provided.  LC reviewed how to use the breast shells, when to use them, and how to clean them.  LC placed ice on the lower part of the breast tissue near the 5th intercostal space for 5 min and showed the birth parent how to massage her breasts. LC encouraged the birth parent to express her milk q3hrs if the infant is not latching to the breast.  The birth parent stated that she was only making drops so she had not been expressing her milk.  LC spoke with the parent about milk production and encouraged her to keep stimulating her breasts by breastfeeding or using the manual pump.  [place] educated the parent about engorgement, breast care, infant I/O, and outpatient services.  The birth parent stated that she had no further questions and she would call for latch assistance.  Current Feeding Plan:  Breastfeed 8+ times per day according to feeding cues.  Put baby to the breast before supplementing.  Supplement according to supplementation guidelines.  Pump q3hrs if the infant does not latch.  Watch infant output and call pediatrician with questions or concerns.  Call outpatient lactation consultant for assistance with breastfeeding.    Maternal Data     Feeding Mother's Current Feeding Choice: Breast Milk and Formula  LATCH Score                    Lactation Tools Discussed/Used    Interventions Interventions: Breast feeding basics reviewed;Breast massage;Reverse pressure;Ice;Education;LC Services brochure  Discharge Discharge Education: Outpatient recommendation;Warning signs for feeding baby;Engorgement and breast care  Consult Status Consult Status: Complete Date: 05/02/22 Follow-up type: Call as needed    Yolanda Myers 05/02/2022, 9:38 AM
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
## 2022-05-03 MED ORDER — MEDROXYPROGESTERONE ACETATE 150 MG/ML IM SUSP: 150.0000 mg | Freq: Once | INTRAMUSCULAR | Status: DC

## 2022-05-03 MED ORDER — INFLUENZA VAC SPLIT QUAD 0.5 ML IM SUSY
0.5000 mL | PREFILLED_SYRINGE | INTRAMUSCULAR | Status: AC
  Administered 2022-05-03: 0.5 mL via INTRAMUSCULAR
  Filled 2022-05-03: qty 0.5

## 2022-05-03 NOTE — Lactation Note (Signed)
This note was copied from a baby's chart. Lactation Consultation Note  [name] [id number] Date: 05/03/2022 Reason for consult: Follow-up assessment;Mother's request;Difficult latch;Primapara;1st time breastfeeding;Early term 37-38.6wks;Infant weight loss;Nipple pain/trauma;Breastfeeding assistance (7.32% WL) Age:21 hours  P1, Early Term, Infant Female, 7.32% WL  LC entered the room and the birth parent stated that she needed help with latching the infant.  She said that she does not have a pump at home and would like to be able to breastfeed her baby for a little while to give her immunity.  LC assisted the birth parent with latching the infant to the right breast in the football hold and cross-cradle position.  The infant latched with her tongue down, lips were flanged, and sucking was rhythmic.  No swallows were noted.  The birth parent stated that the latch was uncomfortable despite a wide gape.  LC pulled down on the infant's chin and the birth parent stated that the latch was more comfortable.  LC encouraged her to latch the infant before bottle feeding and to pump with the manual pump if she is having issues with latching.  A WIC referral was sent.  LC reviewed milk storage guidelines, pumping frequency, and encouraged the parent to call for outpatient assistance if needed.   Current Feeding Plan:  Put the infant on the breast prior to supplementing.  Supplement according to supplementation guidelines.  Watch infant output and call the pediatrician with concerns.  Speak to [facility] peer counselors or outpatient [facility] for assistance with breastfeeding.   Maternal Data    Feeding Mother's Current Feeding Choice: Breast Milk and Formula  LATCH Score Latch: Repeated attempts needed to sustain latch, nipple held in mouth throughout feeding, stimulation needed to elicit sucking reflex. (Infant was falling asleep.)  Audible Swallowing: None  Type of Nipple: Everted  at rest and after stimulation  Comfort (Breast/Nipple): Filling, red/small blisters or bruises, mild/mod discomfort (Compression stripe and scab on left nipple at the midline.)  Hold (Positioning): Assistance needed to correctly position infant at breast and maintain latch.  LATCH Score: 5   Lactation Tools Discussed/Used    Interventions Interventions: Assisted with latch  Discharge Discharge Education: Engorgement and breast care;Warning signs for feeding baby;Outpatient recommendation [place] Program: Yes  Consult Status Consult Status: Complete Date: 05/03/22 Follow-up type: In-patient    Lysbeth Penner 05/03/2022, 12:26 PM

## 2022-05-03 NOTE — Discharge Summary (Signed)
Postpartum Discharge Summary     [name] DOB: Feb 02, 2001 MRN: [id number]  Date of admission: 04/29/2022 Delivery date:05/01/2022  Delivering provider: Christin Fudge  Date of discharge: 05/03/2022  Admitting diagnosis: Pregnancy affected by fetal growth restriction [O36.5990] Intrauterine pregnancy: [redacted]w[redacted]d    Secondary diagnosis:  Active Problems:   Curvature of spine   Fetal growth restriction antepartum   Positive testing for group B Streptococcus  Additional problems: none    Discharge diagnosis: Term Pregnancy Delivered                                              Post partum procedures: none Augmentation: AROM, Pitocin, Cytotec, and IP Foley Complications: None  Hospital course: Induction of Labor With Vaginal Delivery   21y.o. yo G1P0000 at 372w3das admitted to the hospital 04/29/2022 for induction of labor.  Indication for induction:  Fetal growth restriction .  Patient had an uncomplicated labor course as follows: Membrane Rupture Time/Date: 10:11 PM ,04/30/2022   Delivery Method:Vaginal, Spontaneous  Episiotomy: None  Lacerations:  None  Details of delivery can be found in separate delivery note.  Patient had a routine postpartum course. Patient is discharged home 05/03/22 per her request for early d/c if baby can go.  Newborn Data: Birth date:05/01/2022  Birth time:4:56 AM  Gender:Female  Living status:Living  Apgars:7 ,8  Weight:2800 g (6lb 2.8oz)  Magnesium Sulfate received: No BMZ received: No Rhophylac:N/A MMR:N/A T-DaP: declined Flu: No Transfusion:No  Physical exam  Vitals:   05/02/22 0553 05/02/22 1633 05/02/22 2117 05/03/22 0531  BP: 115/64 119/83 114/78 (!) 110/57  Pulse: (!) 51 62 66 63  Resp: 16 17 18 18   Temp: 97.8 F (36.6 C) 98.8 F (37.1 C) 98.7 F (37.1 C) 98.2 F (36.8 C)  TempSrc: Oral Oral Oral Oral  SpO2: 98% 100% 99% 100%  Weight:      Height:       General: alert and  cooperative Lochia: appropriate Uterine Fundus: firm Incision: N/A DVT Evaluation: No evidence of DVT seen on physical exam. Labs: Lab Results  Component Value Date   WBC 13.1 (H) 05/01/2022   HGB 11.7 (L) 05/01/2022   HCT 34.5 (L) 05/01/2022   MCV 94.5 05/01/2022   PLT 110 (L) 05/01/2022      Latest Ref Rng & Units 01/31/2022    2:50 AM  CMP  Glucose 70 - 99 mg/dL 84   BUN 6 - 20 mg/dL 5   Creatinine 0.44 - 1.00 mg/dL 0.91   Sodium 135 - 145 mmol/L 132   Potassium 3.5 - 5.1 mmol/L 3.6   Chloride 98 - 111 mmol/L 101   CO2 22 - 32 mmol/L 21   Calcium 8.9 - 10.3 mg/dL 8.6   Total Protein 6.5 - 8.1 g/dL 5.9   Total Bilirubin 0.3 - 1.2 mg/dL 0.8   Alkaline Phos 38 - 126 U/L 63   AST 15 - 41 U/L 16   ALT 0 - 44 U/L 13    Edinburgh Score:    05/02/2022    5:53 AM  Edinburgh Postnatal Depression Scale Screening Tool  I have been able to laugh and see the funny side of things. 0  I have looked forward with enjoyment to things. 0  I have blamed myself unnecessarily when things went wrong. 0  I  have been anxious or worried for no good reason. 1  I have felt scared or panicky for no good reason. 0  Things have been getting on top of me. 1  I have been so unhappy that I have had difficulty sleeping. 2  I have felt sad or miserable. 1  I have been so unhappy that I have been crying. 1  The thought of harming myself has occurred to me. 0  Edinburgh Postnatal Depression Scale Total 6     After visit meds:  Allergies as of 05/03/2022       Reactions   Imitrex [sumatriptan] Other (See Comments)   flushing   Sulfa Antibiotics         Medication List     STOP taking these medications    Prenatal Vitamin 27-0.8 MG Tabs   terconazole 0.4 % vaginal cream Commonly known as: TERAZOL 7       TAKE these medications    CitraNatal 90 DHA 90-1 & 300 MG Misc Take 1 tablet by mouth daily.   ibuprofen 600 MG tablet Commonly known as: ADVIL Take 1 tablet (600 mg total)  by mouth every 6 (six) hours as needed.        Discharge home in stable condition Infant Feeding: Bottle and Breast Infant Disposition:home with mother Discharge instruction: per After Visit Summary and Postpartum booklet. Activity: Advance as tolerated. Pelvic rest for 6 weeks.  Diet: routine diet Future Appointments: Future Appointments  Date Time Provider [place]  06/03/2022 11:10 AM Myrtis Ser, CNM [facility]   Follow up Visit:   Please schedule this patient for a In person postpartum visit in 4 weeks with the following provider: Any provider. Additional Postpartum F/U:  Low risk pregnancy complicated by:  FGR Delivery mode:  Vaginal, Spontaneous  Anticipated Birth Control:  PP Depo given   05/03/2022 [place], CNM 7:05 AM

## 2022-05-03 NOTE — Progress Notes (Addendum)
CSW met with MOB at bedside to follow up regarding discharge plan. When CSW entered room, MOB was laying in hospital bed, infant was sleeping on her back in bassinet. MOB reports she will discharge home with her mother. MOB reports her mother will purchase diapers,wipes, formula, and a carseat for infant prior to hospital discharge. MOB shared MOB may go to FOB's house to pick up additional infant supplies. CSW provided MOB with [place] non-emergency police phone number and provided education about the option to ask for a police escort if she feels unsafe going to FOB's home with her mother. CSW will provide a pack n play and new baby bundle. MOB declined additional resource needs at this time.   No barriers to discharge.   Signed,  Berniece Salines, MSW, LCSWA, LCASA 05/03/2022 9:54 AM

## 2022-05-04 ENCOUNTER — Other Ambulatory Visit: Payer: Medicaid Other

## 2022-05-07 ENCOUNTER — Encounter: Payer: Medicaid Other | Admitting: Obstetrics & Gynecology

## 2022-05-07 ENCOUNTER — Other Ambulatory Visit: Payer: Medicaid Other

## 2022-05-09 ENCOUNTER — Telehealth (HOSPITAL_COMMUNITY): Payer: Self-pay

## 2022-05-09 NOTE — Telephone Encounter (Signed)
No answer. No voicemail option available.  Sharyn Lull [facility]  05/09/22,0956

## 2022-05-12 ENCOUNTER — Ambulatory Visit: Payer: Self-pay

## 2022-05-12 NOTE — Lactation Note (Signed)
This note was copied from a baby's chart. Lactation Consultation Note  [name] Today's Date: 05/12/2022 Reason for consult: Other (Comment) (Admitted to Peds Unit) Age:21 days  1st visit:  Mom reports that her breasts have not been as full the last couple of days. On breast exam, I noted that her breasts were full (Mom was leaking) and her nipples were intact. I provided size 21 flanges to Mom and she used the DEBP for the 1st time. She was able to obtain 62 mL in less than 15 min. Mom asked that the pump be turned off prior to the 15 min mark b/c of sore nipples. Mom's nipples were "purple" (her description), at the end of the pumping session but they shortly returned to their normal coloring.  Mom reports that she pumps q4h, but has only ever gotten 2 oz over the course of the day instead of at a session. The discrepancy in amount she obtained at home versus during our consult may be attributable to: user error, using the wrong flange size, only using a hand pump, and not being cognizant of how long she is pumping (although Dad mentioned she pumps for 20 min)  Mom is not a great historian--she seems unable to recall facts in regards to time/timing, but the FOB fills in info as able. Mom offers breast for 5 min+ before pt's bottle feedings. Pt gets about 7 bottles/day.   Mom has a [place] appt on 11/5. I called [facility] & requested that Mom obtain a DEBP. The person answering the phone anticipated that she could borrow a multi-user pump when she comes on Thursday until she can obtain one through her insurance company.    Mom would like me to return to help with latching. Mom is not interested in transitioning to exclusively feeding at the breast. She is content with offering breast, then bottle.   2nd visit: I notified Mom that [facility] expected to have a pump available for her on Thursday. I also showed Mom how to disassemble pump parts for washing; answered Mom's questions about  when to pump (after baby goes to the breast/receives a bottle); and going ahead and offering infant more EBM---patient had breastfed and bottle-fed recently (Mom did not call out for latch assist), but patient was sucking on a pacifier. Mom was interested in having an outpatient lactation appt, so a referral was sent.   Infant returned to [facility] as of today.   Lactation Tools Discussed/Used Tools: Flanges;Pump Flange Size: 21 Breast pump type: Double-Electric Breast Pump Pumping frequency: q4h Pumped volume: 62 mL  Interventions Interventions: DEBP;Education  Discharge Discharge Education: Other (comment) (I called [facility]) Pump: Manual  Consult Status Consult Status: Follow-up Date: 05/12/22 Follow-up type: In-patient    Matthias Hughs [facility] 05/12/2022, 1:33 PM

## 2022-05-13 ENCOUNTER — Ambulatory Visit: Payer: Self-pay

## 2022-05-13 NOTE — Lactation Note (Signed)
This note was copied from a baby's chart. Lactation Consultation Note  [name] Today's Date: 05/13/2022   Age:21 days  RN requested 21 flanges for mother which were provided.   Feeding Nipple Type: Nfant Standard Flow (white)     Carlye Grippe 05/13/2022, 3:07 PM

## 2022-06-03 ENCOUNTER — Ambulatory Visit: Payer: Medicaid Other | Admitting: Advanced Practice Midwife

## 2022-06-09 ENCOUNTER — Ambulatory Visit: Payer: Medicaid Other | Admitting: Women's Health
